# Patient Record
Sex: Female | Born: 1945 | Race: White | Hispanic: No | Marital: Married | Smoking: Never smoker
Health system: Southern US, Community
[De-identification: ages and names within clinical notes are randomized; demographics above are authoritative.]

## PROBLEM LIST (undated history)

## (undated) DIAGNOSIS — E039 Hypothyroidism, unspecified: Secondary | ICD-10-CM

## (undated) DIAGNOSIS — M810 Age-related osteoporosis without current pathological fracture: Secondary | ICD-10-CM

## (undated) DIAGNOSIS — I10 Essential (primary) hypertension: Secondary | ICD-10-CM

## (undated) HISTORY — DX: Essential (primary) hypertension: I10

## (undated) HISTORY — DX: Hypothyroidism, unspecified: E03.9

## (undated) HISTORY — DX: Age-related osteoporosis without current pathological fracture: M81.0

## (undated) HISTORY — PX: HAND SURGERY: SHX662

---

## 1998-02-14 ENCOUNTER — Emergency Department (HOSPITAL_COMMUNITY): Admission: EM | Admit: 1998-02-14 | Discharge: 1998-02-14 | Payer: Self-pay

## 1998-02-26 ENCOUNTER — Emergency Department (HOSPITAL_COMMUNITY): Admission: EM | Admit: 1998-02-26 | Discharge: 1998-02-26 | Payer: Self-pay | Admitting: Emergency Medicine

## 1999-06-24 ENCOUNTER — Emergency Department (HOSPITAL_COMMUNITY): Admission: EM | Admit: 1999-06-24 | Discharge: 1999-06-24 | Payer: Self-pay

## 1999-06-30 ENCOUNTER — Ambulatory Visit (HOSPITAL_BASED_OUTPATIENT_CLINIC_OR_DEPARTMENT_OTHER): Admission: RE | Admit: 1999-06-30 | Discharge: 1999-06-30 | Payer: Self-pay | Admitting: Orthopedic Surgery

## 2000-04-13 ENCOUNTER — Encounter: Admission: RE | Admit: 2000-04-13 | Discharge: 2000-04-13 | Payer: Self-pay | Admitting: Family Medicine

## 2000-04-13 ENCOUNTER — Encounter: Payer: Self-pay | Admitting: Family Medicine

## 2000-04-19 ENCOUNTER — Encounter: Payer: Self-pay | Admitting: Family Medicine

## 2000-04-19 ENCOUNTER — Encounter: Admission: RE | Admit: 2000-04-19 | Discharge: 2000-04-19 | Payer: Self-pay | Admitting: Family Medicine

## 2003-12-03 ENCOUNTER — Ambulatory Visit: Payer: Self-pay

## 2003-12-11 ENCOUNTER — Ambulatory Visit: Payer: Self-pay

## 2003-12-17 ENCOUNTER — Ambulatory Visit: Payer: Self-pay

## 2004-01-07 ENCOUNTER — Ambulatory Visit: Payer: Self-pay

## 2004-01-15 ENCOUNTER — Ambulatory Visit: Payer: Self-pay

## 2004-02-11 ENCOUNTER — Ambulatory Visit: Payer: Self-pay

## 2004-03-17 ENCOUNTER — Ambulatory Visit: Payer: Self-pay

## 2004-04-01 ENCOUNTER — Ambulatory Visit: Payer: Self-pay

## 2004-04-07 ENCOUNTER — Ambulatory Visit: Payer: Self-pay

## 2004-04-21 ENCOUNTER — Ambulatory Visit: Payer: Self-pay

## 2004-04-29 ENCOUNTER — Ambulatory Visit: Payer: Self-pay

## 2004-05-05 ENCOUNTER — Ambulatory Visit: Payer: Self-pay

## 2004-05-26 ENCOUNTER — Ambulatory Visit: Payer: Self-pay

## 2004-06-09 ENCOUNTER — Ambulatory Visit: Payer: Self-pay

## 2004-07-01 ENCOUNTER — Ambulatory Visit: Payer: Self-pay

## 2004-07-07 ENCOUNTER — Ambulatory Visit: Payer: Self-pay

## 2004-07-21 ENCOUNTER — Ambulatory Visit: Payer: Self-pay

## 2004-07-28 ENCOUNTER — Ambulatory Visit: Payer: Self-pay

## 2004-08-11 ENCOUNTER — Ambulatory Visit: Payer: Self-pay

## 2004-08-18 ENCOUNTER — Ambulatory Visit: Payer: Self-pay

## 2004-08-25 ENCOUNTER — Ambulatory Visit: Payer: Self-pay

## 2004-09-08 ENCOUNTER — Ambulatory Visit: Payer: Self-pay

## 2004-09-22 ENCOUNTER — Ambulatory Visit: Payer: Self-pay

## 2004-09-30 ENCOUNTER — Ambulatory Visit: Payer: Self-pay

## 2004-10-07 ENCOUNTER — Ambulatory Visit: Payer: Self-pay

## 2004-10-13 ENCOUNTER — Ambulatory Visit: Payer: Self-pay

## 2004-10-20 ENCOUNTER — Ambulatory Visit: Payer: Self-pay

## 2004-10-27 ENCOUNTER — Ambulatory Visit: Payer: Self-pay

## 2004-11-03 ENCOUNTER — Ambulatory Visit: Payer: Self-pay

## 2004-12-01 ENCOUNTER — Ambulatory Visit: Payer: Self-pay

## 2004-12-15 ENCOUNTER — Ambulatory Visit: Payer: Self-pay

## 2005-01-19 ENCOUNTER — Ambulatory Visit: Payer: Self-pay

## 2005-02-03 ENCOUNTER — Ambulatory Visit: Payer: Self-pay

## 2005-03-09 ENCOUNTER — Ambulatory Visit: Payer: Self-pay

## 2005-03-16 ENCOUNTER — Ambulatory Visit: Payer: Self-pay

## 2005-03-31 ENCOUNTER — Ambulatory Visit: Payer: Self-pay

## 2005-04-06 ENCOUNTER — Ambulatory Visit: Payer: Self-pay

## 2005-04-20 ENCOUNTER — Ambulatory Visit: Payer: Self-pay

## 2005-05-11 ENCOUNTER — Ambulatory Visit: Payer: Self-pay

## 2005-05-19 ENCOUNTER — Ambulatory Visit: Payer: Self-pay

## 2005-05-26 ENCOUNTER — Ambulatory Visit: Payer: Self-pay

## 2005-06-01 ENCOUNTER — Ambulatory Visit: Payer: Self-pay

## 2005-06-15 ENCOUNTER — Ambulatory Visit: Payer: Self-pay

## 2005-06-22 ENCOUNTER — Ambulatory Visit: Payer: Self-pay

## 2005-06-29 ENCOUNTER — Ambulatory Visit: Payer: Self-pay

## 2005-07-27 ENCOUNTER — Ambulatory Visit: Payer: Self-pay

## 2005-08-03 ENCOUNTER — Ambulatory Visit: Payer: Self-pay

## 2005-08-10 ENCOUNTER — Ambulatory Visit: Payer: Self-pay

## 2005-08-17 ENCOUNTER — Ambulatory Visit: Payer: Self-pay

## 2005-08-24 ENCOUNTER — Ambulatory Visit: Payer: Self-pay

## 2005-09-07 ENCOUNTER — Ambulatory Visit: Payer: Self-pay

## 2005-10-05 ENCOUNTER — Ambulatory Visit: Payer: Self-pay

## 2005-10-12 ENCOUNTER — Ambulatory Visit: Payer: Self-pay

## 2005-10-19 ENCOUNTER — Ambulatory Visit: Payer: Self-pay

## 2005-11-10 ENCOUNTER — Ambulatory Visit: Payer: Self-pay

## 2005-11-16 ENCOUNTER — Ambulatory Visit: Payer: Self-pay

## 2005-11-23 ENCOUNTER — Ambulatory Visit: Payer: Self-pay

## 2005-11-30 ENCOUNTER — Ambulatory Visit: Payer: Self-pay

## 2005-12-07 ENCOUNTER — Ambulatory Visit: Payer: Self-pay

## 2005-12-14 ENCOUNTER — Ambulatory Visit: Payer: Self-pay

## 2006-01-04 ENCOUNTER — Ambulatory Visit: Payer: Self-pay

## 2006-01-11 ENCOUNTER — Ambulatory Visit: Payer: Self-pay

## 2006-01-18 ENCOUNTER — Ambulatory Visit: Payer: Self-pay

## 2006-02-08 ENCOUNTER — Ambulatory Visit: Payer: Self-pay

## 2006-03-08 ENCOUNTER — Ambulatory Visit: Payer: Self-pay

## 2006-03-29 ENCOUNTER — Ambulatory Visit: Payer: Self-pay

## 2006-04-06 ENCOUNTER — Ambulatory Visit: Payer: Self-pay

## 2006-04-20 ENCOUNTER — Ambulatory Visit: Payer: Self-pay

## 2006-05-03 ENCOUNTER — Ambulatory Visit: Payer: Self-pay

## 2006-05-11 ENCOUNTER — Ambulatory Visit: Payer: Self-pay

## 2006-05-25 ENCOUNTER — Ambulatory Visit: Payer: Self-pay

## 2006-05-31 ENCOUNTER — Ambulatory Visit: Payer: Self-pay

## 2006-06-21 ENCOUNTER — Ambulatory Visit: Payer: Self-pay

## 2006-06-29 ENCOUNTER — Ambulatory Visit: Payer: Self-pay

## 2006-09-21 ENCOUNTER — Ambulatory Visit: Payer: Self-pay

## 2006-09-27 ENCOUNTER — Ambulatory Visit: Payer: Self-pay

## 2006-10-04 ENCOUNTER — Ambulatory Visit: Payer: Self-pay

## 2006-10-12 ENCOUNTER — Ambulatory Visit: Payer: Self-pay

## 2006-11-15 ENCOUNTER — Ambulatory Visit: Payer: Self-pay

## 2006-12-14 ENCOUNTER — Ambulatory Visit: Payer: Self-pay

## 2006-12-16 ENCOUNTER — Encounter: Admission: RE | Admit: 2006-12-16 | Discharge: 2006-12-16 | Payer: Self-pay | Admitting: Emergency Medicine

## 2007-03-21 ENCOUNTER — Ambulatory Visit: Payer: Self-pay

## 2007-04-04 ENCOUNTER — Ambulatory Visit: Payer: Self-pay

## 2007-08-16 ENCOUNTER — Ambulatory Visit: Payer: Self-pay

## 2008-07-17 ENCOUNTER — Ambulatory Visit: Payer: Self-pay

## 2008-07-24 ENCOUNTER — Ambulatory Visit: Payer: Self-pay

## 2008-07-31 ENCOUNTER — Ambulatory Visit: Payer: Self-pay

## 2008-08-07 ENCOUNTER — Ambulatory Visit: Payer: Self-pay

## 2008-08-14 ENCOUNTER — Ambulatory Visit: Payer: Self-pay

## 2008-10-08 ENCOUNTER — Ambulatory Visit: Payer: Self-pay

## 2008-10-22 ENCOUNTER — Ambulatory Visit: Payer: Self-pay

## 2008-10-29 ENCOUNTER — Ambulatory Visit: Payer: Self-pay

## 2008-11-06 ENCOUNTER — Ambulatory Visit: Payer: Self-pay

## 2008-11-26 ENCOUNTER — Ambulatory Visit: Payer: Self-pay

## 2008-12-03 ENCOUNTER — Ambulatory Visit: Payer: Self-pay

## 2008-12-10 ENCOUNTER — Ambulatory Visit: Payer: Self-pay

## 2008-12-18 ENCOUNTER — Ambulatory Visit: Payer: Self-pay

## 2009-01-07 ENCOUNTER — Ambulatory Visit: Payer: Self-pay

## 2009-01-14 ENCOUNTER — Ambulatory Visit: Payer: Self-pay

## 2009-03-18 ENCOUNTER — Ambulatory Visit: Payer: Self-pay

## 2009-03-25 ENCOUNTER — Ambulatory Visit: Payer: Self-pay

## 2009-04-08 ENCOUNTER — Ambulatory Visit: Payer: Self-pay

## 2009-04-23 ENCOUNTER — Ambulatory Visit: Payer: Self-pay

## 2009-04-29 ENCOUNTER — Ambulatory Visit: Payer: Self-pay

## 2009-05-06 ENCOUNTER — Ambulatory Visit: Payer: Self-pay

## 2009-05-13 ENCOUNTER — Ambulatory Visit: Payer: Self-pay

## 2009-05-21 ENCOUNTER — Ambulatory Visit: Payer: Self-pay

## 2009-06-04 ENCOUNTER — Ambulatory Visit: Payer: Self-pay

## 2009-07-08 ENCOUNTER — Ambulatory Visit: Payer: Self-pay

## 2009-07-29 ENCOUNTER — Ambulatory Visit: Payer: Self-pay

## 2009-08-05 ENCOUNTER — Ambulatory Visit: Payer: Self-pay

## 2009-10-07 ENCOUNTER — Ambulatory Visit: Payer: Self-pay

## 2009-10-14 ENCOUNTER — Ambulatory Visit: Payer: Self-pay

## 2009-10-28 ENCOUNTER — Ambulatory Visit: Payer: Self-pay

## 2009-11-26 ENCOUNTER — Ambulatory Visit: Payer: Self-pay

## 2010-01-13 ENCOUNTER — Ambulatory Visit: Payer: Self-pay

## 2010-02-05 ENCOUNTER — Ambulatory Visit: Admission: RE | Admit: 2010-02-05 | Discharge: 2010-02-05 | Payer: Self-pay | Source: Home / Self Care

## 2010-03-24 ENCOUNTER — Ambulatory Visit (INDEPENDENT_AMBULATORY_CARE_PROVIDER_SITE_OTHER): Payer: Self-pay

## 2010-03-24 DIAGNOSIS — F4320 Adjustment disorder, unspecified: Secondary | ICD-10-CM

## 2010-04-07 ENCOUNTER — Ambulatory Visit (INDEPENDENT_AMBULATORY_CARE_PROVIDER_SITE_OTHER): Payer: Self-pay

## 2010-04-07 DIAGNOSIS — F4320 Adjustment disorder, unspecified: Secondary | ICD-10-CM

## 2010-06-09 ENCOUNTER — Ambulatory Visit (INDEPENDENT_AMBULATORY_CARE_PROVIDER_SITE_OTHER): Payer: Self-pay

## 2010-06-09 DIAGNOSIS — F4320 Adjustment disorder, unspecified: Secondary | ICD-10-CM

## 2010-06-17 ENCOUNTER — Ambulatory Visit (INDEPENDENT_AMBULATORY_CARE_PROVIDER_SITE_OTHER): Payer: Self-pay

## 2010-06-17 DIAGNOSIS — F4320 Adjustment disorder, unspecified: Secondary | ICD-10-CM
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
## 2010-06-30 ENCOUNTER — Ambulatory Visit: Payer: Self-pay

## 2010-08-11 ENCOUNTER — Ambulatory Visit (INDEPENDENT_AMBULATORY_CARE_PROVIDER_SITE_OTHER): Payer: Self-pay

## 2010-08-11 DIAGNOSIS — F4320 Adjustment disorder, unspecified: Secondary | ICD-10-CM

## 2010-08-18 ENCOUNTER — Ambulatory Visit (INDEPENDENT_AMBULATORY_CARE_PROVIDER_SITE_OTHER): Payer: Self-pay

## 2010-08-18 DIAGNOSIS — F4320 Adjustment disorder, unspecified: Secondary | ICD-10-CM

## 2010-09-08 ENCOUNTER — Ambulatory Visit (INDEPENDENT_AMBULATORY_CARE_PROVIDER_SITE_OTHER): Payer: Self-pay

## 2010-09-08 DIAGNOSIS — F4320 Adjustment disorder, unspecified: Secondary | ICD-10-CM

## 2011-01-20 ENCOUNTER — Ambulatory Visit (INDEPENDENT_AMBULATORY_CARE_PROVIDER_SITE_OTHER): Payer: Self-pay

## 2011-01-20 DIAGNOSIS — F4320 Adjustment disorder, unspecified: Secondary | ICD-10-CM

## 2011-02-03 ENCOUNTER — Ambulatory Visit (INDEPENDENT_AMBULATORY_CARE_PROVIDER_SITE_OTHER): Payer: Self-pay

## 2011-02-03 DIAGNOSIS — F4320 Adjustment disorder, unspecified: Secondary | ICD-10-CM

## 2011-07-28 ENCOUNTER — Ambulatory Visit (INDEPENDENT_AMBULATORY_CARE_PROVIDER_SITE_OTHER): Payer: Self-pay

## 2011-07-28 DIAGNOSIS — F4320 Adjustment disorder, unspecified: Secondary | ICD-10-CM

## 2011-10-20 ENCOUNTER — Ambulatory Visit (INDEPENDENT_AMBULATORY_CARE_PROVIDER_SITE_OTHER): Payer: PRIVATE HEALTH INSURANCE | Admitting: Family Medicine

## 2011-10-20 ENCOUNTER — Ambulatory Visit: Payer: PRIVATE HEALTH INSURANCE

## 2011-10-20 VITALS — BP 170/94 | HR 86 | Temp 98.9°F | Resp 16 | Ht 64.0 in | Wt 149.2 lb

## 2011-10-20 DIAGNOSIS — S92309A Fracture of unspecified metatarsal bone(s), unspecified foot, initial encounter for closed fracture: Secondary | ICD-10-CM

## 2011-10-20 DIAGNOSIS — M79673 Pain in unspecified foot: Secondary | ICD-10-CM

## 2011-10-20 DIAGNOSIS — S92353A Displaced fracture of fifth metatarsal bone, unspecified foot, initial encounter for closed fracture: Secondary | ICD-10-CM | POA: Insufficient documentation

## 2011-10-20 DIAGNOSIS — M79609 Pain in unspecified limb: Secondary | ICD-10-CM

## 2011-10-20 MED ORDER — TRAMADOL HCL 50 MG PO TABS: 50.0000 mg | ORAL_TABLET | Freq: Three times a day (TID) | ORAL | Status: DC | PRN

## 2011-10-20 NOTE — Patient Instructions (Signed)
Given verbal instructions

## 2011-10-20 NOTE — Assessment & Plan Note (Signed)
Patient does have a nondisplaced nonangulated fracture of the fifth metatarsal of the shaft.  Postop boot for the next 3 weeks. Patient is able to weight-bear as tolerated. Patient is going to be sent and tramadol for severe pain. Patient has an allergy to aspirin so we'll avoid anti-inflammatories such as Motrin. Patient will followup in 3 weeks' time for reevaluation and at that time we'll get further imaging to make sure the patient's is healing appropriately.

## 2011-10-20 NOTE — Progress Notes (Signed)
  Subjective:    Patient ID: Pamela Kim, female    DOB: Jul 27, 1945, 66 y.o.   MRN: [id number]  HPI  66 year old female coming in with right ankle pain. Patient states that she woke up from a nap today and had significant pain when she took her first. Patient heard a loud pop and then had significant swelling of her foot and lateral aspect of her ankle. Patient was able to bear weight but had significant pain on palpation over the swelling area. Patient came in to be seen in. Patient denies any numbness or any discoloration in the area but still has significant swelling she states. Patient has had multiple ankle sprains on the side. Patient is able to bear weight at this time but does have pain.  Past medical surgical and family history unremarkable as related to the chief complaint. Review of systems as related to the chief complaint is in history of present illness.   Review of Systems     Objective:   Physical Exam General: No apparent distress alert and oriented female patient does have some pressured speech at baseline. Respiratory: Patient's recent full sentences does not appear short of breath Right ankle exam: Patient is able to move of the entire right ankle and has no pain on the lateral or medial malleolus. Patient also has no pain over the mortise. Patient has no swelling of the ankle. Distal to the ankle at the area of the ATF L. patient does have a 3 cm area of swelling about the size of a golf ball and is minorly tender to palpation. Patient does have significant tenderness over the fifth metatarsal but at the shaft not at the head were distally. Patient is able to bear weight on foot. Patient is neurovascularly intact distally and 2+ DTR of the Achilles tendon.       Assessment & Plan:  X-rays were ordered and reviewed by me today. Patient does have a fifth metatarsal shaft fracture that is oblique but with nondisplaced non-angulation. Patient's ankle mortise is intact  patient does have a significant effusion on the lateral aspect of the foot.

## 2011-10-21 ENCOUNTER — Telehealth: Payer: Self-pay

## 2011-10-21 NOTE — Telephone Encounter (Signed)
Pt is wanting to talk with dr Ayesha Mohair about referring her to an orthopedic office   Best number [phone]

## 2011-10-21 NOTE — Progress Notes (Signed)
Xray read and patient discussed with Dr.Smith. Xray report noted:IMPRESSION: Nondisplaced fracture mid diaphysis fifth metatarsal. Agree with assessment and plan of care per Dr Michaelle Copas  note.

## 2011-10-21 NOTE — Telephone Encounter (Signed)
I will be happy to go ahead and set up a referral for her. Does she already have an orthopedist, or would she like for me to pick one?

## 2011-10-22 NOTE — Telephone Encounter (Signed)
Spoke w/pt who reported that she was actually able to get in to see a podiatrist yesterday who has changed her tx to stay off of foot for a week, and she will wait to see what happens w/this. Pt agrees to RTC or CB for ortho referral if needed and she thanked [place] and stated that Dr Ayesha Mohair was very nice and did a great job.

## 2011-11-16 ENCOUNTER — Ambulatory Visit (INDEPENDENT_AMBULATORY_CARE_PROVIDER_SITE_OTHER): Payer: PRIVATE HEALTH INSURANCE | Admitting: Emergency Medicine

## 2011-11-16 ENCOUNTER — Telehealth: Payer: Self-pay

## 2011-11-16 VITALS — BP 159/79 | HR 90 | Temp 98.4°F | Resp 18

## 2011-11-16 DIAGNOSIS — R110 Nausea: Secondary | ICD-10-CM

## 2011-11-16 DIAGNOSIS — E860 Dehydration: Secondary | ICD-10-CM

## 2011-11-16 DIAGNOSIS — K5289 Other specified noninfective gastroenteritis and colitis: Secondary | ICD-10-CM

## 2011-11-16 DIAGNOSIS — K529 Noninfective gastroenteritis and colitis, unspecified: Secondary | ICD-10-CM

## 2011-11-16 MED ORDER — LOPERAMIDE HCL 2 MG PO TABS: ORAL_TABLET | ORAL | Status: DC

## 2011-11-16 MED ORDER — ONDANSETRON 4 MG PO TBDP
4.0000 mg | ORAL_TABLET | Freq: Once | ORAL | Status: AC
  Administered 2011-11-16: 4 mg via ORAL

## 2011-11-16 MED ORDER — ONDANSETRON 4 MG PO TBDP: 4.0000 mg | ORAL_TABLET | Freq: Three times a day (TID) | ORAL | Status: DC | PRN

## 2011-11-16 NOTE — Progress Notes (Signed)
[facility] [address] [place] [address] [phone]  Date:  11/16/2011   Name:  Pamela Kim   DOB:  Sep 24, 1945   MRN:  [id number]  PCP:  Roque Lias, MD    Chief Complaint: Emesis   History of Present Illness:  Pamela Kim is a 66 y.o. very pleasant female patient who presents with the following:  Ill since 0200 this morning with nausea and vomiting and diarrhea.  Complicated by her inability to find a comfortable position for her broken foot.  Feels hot but no fever and does have chills.  Stools are watery and frequent with cramping.  Not able tolerate liquids.  Child was visiting over the weekend and had been ill last week with GI virus.  No OTC meds for treatment.  No blood mucous or pus in stools.  Patient Active Problem List  Diagnosis  . Fracture of 5th metatarsal    No past medical history on file.  No past surgical history on file.  History  Substance Use Topics  . Smoking status: Never Smoker   . Smokeless tobacco: Not on file  . Alcohol Use: Not on file    No family history on file.  Allergies  Allergen Reactions  . Aspirin Hives  . Keflex (Cephalexin) Hives  . Penicillins Hives    Medication list has been reviewed and updated.  Current Outpatient Prescriptions on File Prior to Visit  Medication Sig Dispense Refill  . thyroid (ARMOUR) 30 MG tablet Take 30 mg by mouth 2 (two) times daily.      . traMADol (ULTRAM) 50 MG tablet Take 1 tablet (50 mg total) by mouth every 8 (eight) hours as needed for pain.  30 tablet  0    Review of Systems:  As per HPI, otherwise negative.    Physical Examination: Filed Vitals:   11/16/11 1054  BP: 159/79  Pulse: 90  Temp: 98.4 F (36.9 C)  Resp: 18   There were no vitals filed for this visit. There is no height or weight on file to calculate BMI. Ideal Body Weight:     GEN: WDWN, NAD, Non-toxic, A & O x 3.  dehydrated HEENT: Atraumatic, Normocephalic.  Neck supple. No masses, No LAD. Ears and Nose: No external deformity. CV: RRR, No M/G/R. No JVD. No thrill. No extra heart sounds. PULM: CTA B, no wheezes, crackles, rhonchi. No retractions. No resp. distress. No accessory muscle use. ABD: S, NT, ND, +BS. No rebound. No HSM. EXTR: No c/c/e NEURO Normal gait.  PSYCH: Normally interactive. Conversant. Not depressed or anxious appearing.  Calm demeanor.      Assessment and Plan: Dehydration Gastrointestinal virus History of metatarsal fracture. Zofran, imodium Liquids 2 liters NSS IV  Carmelina Dane, MD  I have reviewed and agree with documentation. Robert P. Merla Riches, M.D.

## 2011-11-16 NOTE — Telephone Encounter (Signed)
Pt is throwing up - wants phrenilin for vomiting.      [facility] [address]   [phone]

## 2011-11-17 ENCOUNTER — Other Ambulatory Visit: Payer: Self-pay | Admitting: Emergency Medicine

## 2011-11-17 MED ORDER — PROMETHAZINE HCL 25 MG RE SUPP: 25.0000 mg | Freq: Four times a day (QID) | RECTAL | Status: DC | PRN

## 2011-11-17 NOTE — Telephone Encounter (Signed)
Patient called back asking for phenergan, you saw her earlier, she states Zofran not helping

## 2012-07-07 DIAGNOSIS — S92919A Unspecified fracture of unspecified toe(s), initial encounter for closed fracture: Secondary | ICD-10-CM | POA: Diagnosis not present

## 2012-08-24 DIAGNOSIS — E039 Hypothyroidism, unspecified: Secondary | ICD-10-CM | POA: Diagnosis not present

## 2012-08-24 DIAGNOSIS — R030 Elevated blood-pressure reading, without diagnosis of hypertension: Secondary | ICD-10-CM | POA: Diagnosis not present

## 2012-09-28 DIAGNOSIS — H53009 Unspecified amblyopia, unspecified eye: Secondary | ICD-10-CM | POA: Diagnosis not present

## 2012-09-28 DIAGNOSIS — H35419 Lattice degeneration of retina, unspecified eye: Secondary | ICD-10-CM | POA: Diagnosis not present

## 2012-10-13 DIAGNOSIS — T6391XA Toxic effect of contact with unspecified venomous animal, accidental (unintentional), initial encounter: Secondary | ICD-10-CM | POA: Diagnosis not present

## 2012-11-28 ENCOUNTER — Ambulatory Visit (INDEPENDENT_AMBULATORY_CARE_PROVIDER_SITE_OTHER): Payer: Self-pay

## 2012-11-28 DIAGNOSIS — F4320 Adjustment disorder, unspecified: Secondary | ICD-10-CM

## 2012-12-18 DIAGNOSIS — E039 Hypothyroidism, unspecified: Secondary | ICD-10-CM | POA: Diagnosis not present

## 2012-12-18 DIAGNOSIS — E785 Hyperlipidemia, unspecified: Secondary | ICD-10-CM | POA: Diagnosis not present

## 2012-12-18 DIAGNOSIS — I10 Essential (primary) hypertension: Secondary | ICD-10-CM | POA: Diagnosis not present

## 2012-12-25 DIAGNOSIS — E039 Hypothyroidism, unspecified: Secondary | ICD-10-CM | POA: Diagnosis not present

## 2012-12-25 DIAGNOSIS — R030 Elevated blood-pressure reading, without diagnosis of hypertension: Secondary | ICD-10-CM | POA: Diagnosis not present

## 2012-12-25 DIAGNOSIS — Z23 Encounter for immunization: Secondary | ICD-10-CM | POA: Diagnosis not present

## 2012-12-25 DIAGNOSIS — E785 Hyperlipidemia, unspecified: Secondary | ICD-10-CM | POA: Diagnosis not present

## 2013-01-03 DIAGNOSIS — H25049 Posterior subcapsular polar age-related cataract, unspecified eye: Secondary | ICD-10-CM | POA: Diagnosis not present

## 2013-01-03 DIAGNOSIS — H25019 Cortical age-related cataract, unspecified eye: Secondary | ICD-10-CM | POA: Diagnosis not present

## 2013-01-03 DIAGNOSIS — H2510 Age-related nuclear cataract, unspecified eye: Secondary | ICD-10-CM | POA: Diagnosis not present

## 2013-02-06 DIAGNOSIS — B029 Zoster without complications: Secondary | ICD-10-CM | POA: Diagnosis not present

## 2013-06-08 DIAGNOSIS — E785 Hyperlipidemia, unspecified: Secondary | ICD-10-CM | POA: Diagnosis not present

## 2013-06-08 DIAGNOSIS — E039 Hypothyroidism, unspecified: Secondary | ICD-10-CM | POA: Diagnosis not present

## 2013-06-14 DIAGNOSIS — E039 Hypothyroidism, unspecified: Secondary | ICD-10-CM | POA: Diagnosis not present

## 2013-06-27 DIAGNOSIS — E785 Hyperlipidemia, unspecified: Secondary | ICD-10-CM | POA: Diagnosis not present

## 2013-06-27 DIAGNOSIS — R030 Elevated blood-pressure reading, without diagnosis of hypertension: Secondary | ICD-10-CM | POA: Diagnosis not present

## 2013-06-27 DIAGNOSIS — E039 Hypothyroidism, unspecified: Secondary | ICD-10-CM | POA: Diagnosis not present

## 2013-12-31 DIAGNOSIS — E039 Hypothyroidism, unspecified: Secondary | ICD-10-CM | POA: Diagnosis not present

## 2013-12-31 DIAGNOSIS — E785 Hyperlipidemia, unspecified: Secondary | ICD-10-CM | POA: Diagnosis not present

## 2013-12-31 DIAGNOSIS — M859 Disorder of bone density and structure, unspecified: Secondary | ICD-10-CM | POA: Diagnosis not present

## 2013-12-31 DIAGNOSIS — Z0000 Encounter for general adult medical examination without abnormal findings: Secondary | ICD-10-CM | POA: Diagnosis not present

## 2014-01-07 DIAGNOSIS — E039 Hypothyroidism, unspecified: Secondary | ICD-10-CM | POA: Diagnosis not present

## 2014-01-07 DIAGNOSIS — E785 Hyperlipidemia, unspecified: Secondary | ICD-10-CM | POA: Diagnosis not present

## 2014-01-07 DIAGNOSIS — M810 Age-related osteoporosis without current pathological fracture: Secondary | ICD-10-CM | POA: Diagnosis not present

## 2014-01-10 DIAGNOSIS — H2513 Age-related nuclear cataract, bilateral: Secondary | ICD-10-CM | POA: Diagnosis not present

## 2014-01-10 DIAGNOSIS — H25013 Cortical age-related cataract, bilateral: Secondary | ICD-10-CM | POA: Diagnosis not present

## 2014-01-10 DIAGNOSIS — H35413 Lattice degeneration of retina, bilateral: Secondary | ICD-10-CM | POA: Diagnosis not present

## 2014-01-10 DIAGNOSIS — H53001 Unspecified amblyopia, right eye: Secondary | ICD-10-CM | POA: Diagnosis not present

## 2014-01-12 DIAGNOSIS — Z23 Encounter for immunization: Secondary | ICD-10-CM | POA: Diagnosis not present

## 2014-02-01 HISTORY — PX: CATARACT EXTRACTION, BILATERAL: SHX1313

## 2014-02-07 DIAGNOSIS — H35413 Lattice degeneration of retina, bilateral: Secondary | ICD-10-CM | POA: Diagnosis not present

## 2014-02-07 DIAGNOSIS — H33323 Round hole, bilateral: Secondary | ICD-10-CM | POA: Diagnosis not present

## 2014-02-18 DIAGNOSIS — H25013 Cortical age-related cataract, bilateral: Secondary | ICD-10-CM | POA: Diagnosis not present

## 2014-02-18 DIAGNOSIS — H53001 Unspecified amblyopia, right eye: Secondary | ICD-10-CM | POA: Diagnosis not present

## 2014-02-18 DIAGNOSIS — H2513 Age-related nuclear cataract, bilateral: Secondary | ICD-10-CM | POA: Diagnosis not present

## 2014-03-13 DIAGNOSIS — H25811 Combined forms of age-related cataract, right eye: Secondary | ICD-10-CM | POA: Diagnosis not present

## 2014-03-13 DIAGNOSIS — H53009 Unspecified amblyopia, unspecified eye: Secondary | ICD-10-CM | POA: Diagnosis not present

## 2014-03-13 DIAGNOSIS — H25011 Cortical age-related cataract, right eye: Secondary | ICD-10-CM | POA: Diagnosis not present

## 2014-03-13 DIAGNOSIS — H2511 Age-related nuclear cataract, right eye: Secondary | ICD-10-CM | POA: Diagnosis not present

## 2014-03-27 DIAGNOSIS — H25012 Cortical age-related cataract, left eye: Secondary | ICD-10-CM | POA: Diagnosis not present

## 2014-03-27 DIAGNOSIS — H2512 Age-related nuclear cataract, left eye: Secondary | ICD-10-CM | POA: Diagnosis not present

## 2014-03-27 DIAGNOSIS — H25812 Combined forms of age-related cataract, left eye: Secondary | ICD-10-CM | POA: Diagnosis not present

## 2014-04-02 DIAGNOSIS — E039 Hypothyroidism, unspecified: Secondary | ICD-10-CM | POA: Diagnosis not present

## 2014-04-09 DIAGNOSIS — E039 Hypothyroidism, unspecified: Secondary | ICD-10-CM | POA: Diagnosis not present

## 2014-05-20 DIAGNOSIS — E039 Hypothyroidism, unspecified: Secondary | ICD-10-CM | POA: Diagnosis not present

## 2014-07-12 DIAGNOSIS — E785 Hyperlipidemia, unspecified: Secondary | ICD-10-CM | POA: Diagnosis not present

## 2014-07-12 DIAGNOSIS — M859 Disorder of bone density and structure, unspecified: Secondary | ICD-10-CM | POA: Diagnosis not present

## 2014-07-16 DIAGNOSIS — E785 Hyperlipidemia, unspecified: Secondary | ICD-10-CM | POA: Diagnosis not present

## 2014-07-16 DIAGNOSIS — M810 Age-related osteoporosis without current pathological fracture: Secondary | ICD-10-CM | POA: Diagnosis not present

## 2014-07-16 DIAGNOSIS — E039 Hypothyroidism, unspecified: Secondary | ICD-10-CM | POA: Diagnosis not present

## 2014-10-10 DIAGNOSIS — H35413 Lattice degeneration of retina, bilateral: Secondary | ICD-10-CM | POA: Diagnosis not present

## 2014-10-10 DIAGNOSIS — H43811 Vitreous degeneration, right eye: Secondary | ICD-10-CM | POA: Diagnosis not present

## 2014-10-14 DIAGNOSIS — H52203 Unspecified astigmatism, bilateral: Secondary | ICD-10-CM | POA: Diagnosis not present

## 2014-10-14 DIAGNOSIS — Z961 Presence of intraocular lens: Secondary | ICD-10-CM | POA: Diagnosis not present

## 2014-10-14 DIAGNOSIS — H53001 Unspecified amblyopia, right eye: Secondary | ICD-10-CM | POA: Diagnosis not present

## 2014-10-14 DIAGNOSIS — H26491 Other secondary cataract, right eye: Secondary | ICD-10-CM | POA: Diagnosis not present

## 2014-10-17 DIAGNOSIS — E039 Hypothyroidism, unspecified: Secondary | ICD-10-CM | POA: Diagnosis not present

## 2014-10-21 DIAGNOSIS — E039 Hypothyroidism, unspecified: Secondary | ICD-10-CM | POA: Diagnosis not present

## 2015-01-06 DIAGNOSIS — M810 Age-related osteoporosis without current pathological fracture: Secondary | ICD-10-CM | POA: Diagnosis not present

## 2015-01-06 DIAGNOSIS — E785 Hyperlipidemia, unspecified: Secondary | ICD-10-CM | POA: Diagnosis not present

## 2015-01-16 DIAGNOSIS — E785 Hyperlipidemia, unspecified: Secondary | ICD-10-CM | POA: Diagnosis not present

## 2015-01-16 DIAGNOSIS — N182 Chronic kidney disease, stage 2 (mild): Secondary | ICD-10-CM | POA: Diagnosis not present

## 2015-01-16 DIAGNOSIS — M810 Age-related osteoporosis without current pathological fracture: Secondary | ICD-10-CM | POA: Diagnosis not present

## 2015-01-16 DIAGNOSIS — E039 Hypothyroidism, unspecified: Secondary | ICD-10-CM | POA: Diagnosis not present

## 2015-05-05 DIAGNOSIS — E039 Hypothyroidism, unspecified: Secondary | ICD-10-CM | POA: Diagnosis not present

## 2015-07-14 DIAGNOSIS — E039 Hypothyroidism, unspecified: Secondary | ICD-10-CM | POA: Diagnosis not present

## 2015-07-14 DIAGNOSIS — E559 Vitamin D deficiency, unspecified: Secondary | ICD-10-CM | POA: Diagnosis not present

## 2015-07-14 DIAGNOSIS — M810 Age-related osteoporosis without current pathological fracture: Secondary | ICD-10-CM | POA: Diagnosis not present

## 2015-07-14 DIAGNOSIS — E785 Hyperlipidemia, unspecified: Secondary | ICD-10-CM | POA: Diagnosis not present

## 2015-07-24 DIAGNOSIS — E785 Hyperlipidemia, unspecified: Secondary | ICD-10-CM | POA: Diagnosis not present

## 2015-07-24 DIAGNOSIS — M810 Age-related osteoporosis without current pathological fracture: Secondary | ICD-10-CM | POA: Diagnosis not present

## 2015-07-24 DIAGNOSIS — E039 Hypothyroidism, unspecified: Secondary | ICD-10-CM | POA: Diagnosis not present

## 2015-07-24 DIAGNOSIS — N182 Chronic kidney disease, stage 2 (mild): Secondary | ICD-10-CM | POA: Diagnosis not present

## 2015-07-24 DIAGNOSIS — Z23 Encounter for immunization: Secondary | ICD-10-CM | POA: Diagnosis not present

## 2015-09-01 DIAGNOSIS — H35371 Puckering of macula, right eye: Secondary | ICD-10-CM | POA: Diagnosis not present

## 2015-09-01 DIAGNOSIS — Z961 Presence of intraocular lens: Secondary | ICD-10-CM | POA: Diagnosis not present

## 2015-09-01 DIAGNOSIS — H43813 Vitreous degeneration, bilateral: Secondary | ICD-10-CM | POA: Diagnosis not present

## 2015-09-01 DIAGNOSIS — H52203 Unspecified astigmatism, bilateral: Secondary | ICD-10-CM | POA: Diagnosis not present

## 2015-09-30 DIAGNOSIS — H35413 Lattice degeneration of retina, bilateral: Secondary | ICD-10-CM | POA: Diagnosis not present

## 2015-10-14 DIAGNOSIS — E785 Hyperlipidemia, unspecified: Secondary | ICD-10-CM | POA: Diagnosis not present

## 2015-10-14 DIAGNOSIS — M810 Age-related osteoporosis without current pathological fracture: Secondary | ICD-10-CM | POA: Diagnosis not present

## 2015-10-14 DIAGNOSIS — E039 Hypothyroidism, unspecified: Secondary | ICD-10-CM | POA: Diagnosis not present

## 2015-10-21 DIAGNOSIS — E039 Hypothyroidism, unspecified: Secondary | ICD-10-CM | POA: Diagnosis not present

## 2015-12-02 DIAGNOSIS — Z1211 Encounter for screening for malignant neoplasm of colon: Secondary | ICD-10-CM | POA: Diagnosis not present

## 2015-12-02 DIAGNOSIS — E039 Hypothyroidism, unspecified: Secondary | ICD-10-CM | POA: Diagnosis not present

## 2016-02-27 DIAGNOSIS — E039 Hypothyroidism, unspecified: Secondary | ICD-10-CM | POA: Diagnosis not present

## 2016-03-30 DIAGNOSIS — H33323 Round hole, bilateral: Secondary | ICD-10-CM | POA: Diagnosis not present

## 2016-03-30 DIAGNOSIS — H43813 Vitreous degeneration, bilateral: Secondary | ICD-10-CM | POA: Diagnosis not present

## 2016-05-04 DIAGNOSIS — E039 Hypothyroidism, unspecified: Secondary | ICD-10-CM | POA: Diagnosis not present

## 2016-07-02 DIAGNOSIS — E039 Hypothyroidism, unspecified: Secondary | ICD-10-CM | POA: Diagnosis not present

## 2016-07-05 DIAGNOSIS — E039 Hypothyroidism, unspecified: Secondary | ICD-10-CM | POA: Diagnosis not present

## 2016-09-03 DIAGNOSIS — E039 Hypothyroidism, unspecified: Secondary | ICD-10-CM | POA: Diagnosis not present

## 2016-09-07 DIAGNOSIS — H53001 Unspecified amblyopia, right eye: Secondary | ICD-10-CM | POA: Diagnosis not present

## 2016-09-07 DIAGNOSIS — H1859 Other hereditary corneal dystrophies: Secondary | ICD-10-CM | POA: Diagnosis not present

## 2016-09-07 DIAGNOSIS — H26491 Other secondary cataract, right eye: Secondary | ICD-10-CM | POA: Diagnosis not present

## 2016-09-07 DIAGNOSIS — H52203 Unspecified astigmatism, bilateral: Secondary | ICD-10-CM | POA: Diagnosis not present

## 2016-09-28 DIAGNOSIS — H35413 Lattice degeneration of retina, bilateral: Secondary | ICD-10-CM | POA: Diagnosis not present

## 2016-09-28 DIAGNOSIS — H43813 Vitreous degeneration, bilateral: Secondary | ICD-10-CM | POA: Diagnosis not present

## 2016-11-09 DIAGNOSIS — E039 Hypothyroidism, unspecified: Secondary | ICD-10-CM | POA: Diagnosis not present

## 2017-01-04 DIAGNOSIS — E039 Hypothyroidism, unspecified: Secondary | ICD-10-CM | POA: Diagnosis not present

## 2017-03-29 DIAGNOSIS — H35413 Lattice degeneration of retina, bilateral: Secondary | ICD-10-CM | POA: Diagnosis not present

## 2017-03-29 DIAGNOSIS — H43813 Vitreous degeneration, bilateral: Secondary | ICD-10-CM | POA: Diagnosis not present

## 2017-06-28 DIAGNOSIS — E039 Hypothyroidism, unspecified: Secondary | ICD-10-CM | POA: Diagnosis not present

## 2017-07-05 DIAGNOSIS — E039 Hypothyroidism, unspecified: Secondary | ICD-10-CM | POA: Diagnosis not present

## 2017-09-07 DIAGNOSIS — E039 Hypothyroidism, unspecified: Secondary | ICD-10-CM | POA: Diagnosis not present

## 2017-09-28 DIAGNOSIS — H43813 Vitreous degeneration, bilateral: Secondary | ICD-10-CM | POA: Diagnosis not present

## 2017-09-28 DIAGNOSIS — H35413 Lattice degeneration of retina, bilateral: Secondary | ICD-10-CM | POA: Diagnosis not present

## 2017-11-08 DIAGNOSIS — E039 Hypothyroidism, unspecified: Secondary | ICD-10-CM | POA: Diagnosis not present

## 2017-12-22 ENCOUNTER — Other Ambulatory Visit: Payer: Self-pay | Admitting: Family Medicine

## 2017-12-22 DIAGNOSIS — Z0000 Encounter for general adult medical examination without abnormal findings: Secondary | ICD-10-CM | POA: Diagnosis not present

## 2017-12-22 DIAGNOSIS — R030 Elevated blood-pressure reading, without diagnosis of hypertension: Secondary | ICD-10-CM | POA: Diagnosis not present

## 2017-12-22 DIAGNOSIS — E7849 Other hyperlipidemia: Secondary | ICD-10-CM | POA: Diagnosis not present

## 2017-12-22 DIAGNOSIS — E039 Hypothyroidism, unspecified: Secondary | ICD-10-CM | POA: Diagnosis not present

## 2017-12-22 DIAGNOSIS — I10 Essential (primary) hypertension: Secondary | ICD-10-CM | POA: Diagnosis not present

## 2017-12-22 DIAGNOSIS — Z1231 Encounter for screening mammogram for malignant neoplasm of breast: Secondary | ICD-10-CM

## 2017-12-22 DIAGNOSIS — E559 Vitamin D deficiency, unspecified: Secondary | ICD-10-CM | POA: Diagnosis not present

## 2017-12-22 DIAGNOSIS — Z23 Encounter for immunization: Secondary | ICD-10-CM | POA: Diagnosis not present

## 2017-12-22 DIAGNOSIS — G4700 Insomnia, unspecified: Secondary | ICD-10-CM | POA: Diagnosis not present

## 2017-12-22 DIAGNOSIS — R7301 Impaired fasting glucose: Secondary | ICD-10-CM | POA: Diagnosis not present

## 2018-02-08 DIAGNOSIS — Z1211 Encounter for screening for malignant neoplasm of colon: Secondary | ICD-10-CM | POA: Diagnosis not present

## 2018-02-08 DIAGNOSIS — E039 Hypothyroidism, unspecified: Secondary | ICD-10-CM | POA: Diagnosis not present

## 2018-02-08 DIAGNOSIS — K625 Hemorrhage of anus and rectum: Secondary | ICD-10-CM | POA: Diagnosis not present

## 2018-02-09 ENCOUNTER — Ambulatory Visit
Admission: RE | Admit: 2018-02-09 | Discharge: 2018-02-09 | Disposition: A | Discharge status: Home / Self Care | Payer: PPO | Source: Ambulatory Visit | Attending: Family Medicine | Admitting: Family Medicine

## 2018-02-09 DIAGNOSIS — Z1231 Encounter for screening mammogram for malignant neoplasm of breast: Secondary | ICD-10-CM

## 2018-02-09 LAB — HM MAMMOGRAPHY: HM Mammogram: NORMAL (ref 0–4)

## 2018-07-05 DIAGNOSIS — E039 Hypothyroidism, unspecified: Secondary | ICD-10-CM | POA: Diagnosis not present

## 2018-07-06 DIAGNOSIS — E039 Hypothyroidism, unspecified: Secondary | ICD-10-CM | POA: Diagnosis not present

## 2018-10-11 DIAGNOSIS — E039 Hypothyroidism, unspecified: Secondary | ICD-10-CM | POA: Diagnosis not present

## 2018-10-31 DIAGNOSIS — Z23 Encounter for immunization: Secondary | ICD-10-CM | POA: Diagnosis not present

## 2018-10-31 DIAGNOSIS — R4789 Other speech disturbances: Secondary | ICD-10-CM | POA: Diagnosis not present

## 2018-12-12 DIAGNOSIS — E559 Vitamin D deficiency, unspecified: Secondary | ICD-10-CM | POA: Diagnosis not present

## 2018-12-12 DIAGNOSIS — R7309 Other abnormal glucose: Secondary | ICD-10-CM | POA: Diagnosis not present

## 2018-12-12 DIAGNOSIS — Z23 Encounter for immunization: Secondary | ICD-10-CM | POA: Diagnosis not present

## 2018-12-12 DIAGNOSIS — G4700 Insomnia, unspecified: Secondary | ICD-10-CM | POA: Diagnosis not present

## 2018-12-12 DIAGNOSIS — I10 Essential (primary) hypertension: Secondary | ICD-10-CM | POA: Diagnosis not present

## 2018-12-12 DIAGNOSIS — R4789 Other speech disturbances: Secondary | ICD-10-CM | POA: Diagnosis not present

## 2018-12-12 DIAGNOSIS — E039 Hypothyroidism, unspecified: Secondary | ICD-10-CM | POA: Diagnosis not present

## 2018-12-15 DIAGNOSIS — R4789 Other speech disturbances: Secondary | ICD-10-CM | POA: Diagnosis not present

## 2018-12-15 DIAGNOSIS — E039 Hypothyroidism, unspecified: Secondary | ICD-10-CM | POA: Diagnosis not present

## 2018-12-15 DIAGNOSIS — I10 Essential (primary) hypertension: Secondary | ICD-10-CM | POA: Diagnosis not present

## 2018-12-15 DIAGNOSIS — Z23 Encounter for immunization: Secondary | ICD-10-CM | POA: Diagnosis not present

## 2018-12-19 DIAGNOSIS — H6121 Impacted cerumen, right ear: Secondary | ICD-10-CM | POA: Diagnosis not present

## 2018-12-19 DIAGNOSIS — E039 Hypothyroidism, unspecified: Secondary | ICD-10-CM | POA: Diagnosis not present

## 2018-12-19 DIAGNOSIS — I10 Essential (primary) hypertension: Secondary | ICD-10-CM | POA: Diagnosis not present

## 2018-12-19 DIAGNOSIS — Z23 Encounter for immunization: Secondary | ICD-10-CM | POA: Diagnosis not present

## 2019-01-30 DIAGNOSIS — H6121 Impacted cerumen, right ear: Secondary | ICD-10-CM | POA: Diagnosis not present

## 2019-01-30 DIAGNOSIS — I10 Essential (primary) hypertension: Secondary | ICD-10-CM | POA: Diagnosis not present

## 2019-01-30 DIAGNOSIS — R030 Elevated blood-pressure reading, without diagnosis of hypertension: Secondary | ICD-10-CM | POA: Diagnosis not present

## 2019-01-30 DIAGNOSIS — E039 Hypothyroidism, unspecified: Secondary | ICD-10-CM | POA: Diagnosis not present

## 2019-02-06 DIAGNOSIS — R030 Elevated blood-pressure reading, without diagnosis of hypertension: Secondary | ICD-10-CM | POA: Diagnosis not present

## 2019-02-06 DIAGNOSIS — H6121 Impacted cerumen, right ear: Secondary | ICD-10-CM | POA: Diagnosis not present

## 2019-02-06 DIAGNOSIS — I10 Essential (primary) hypertension: Secondary | ICD-10-CM | POA: Diagnosis not present

## 2019-02-06 DIAGNOSIS — E039 Hypothyroidism, unspecified: Secondary | ICD-10-CM | POA: Diagnosis not present

## 2019-02-08 DIAGNOSIS — Z23 Encounter for immunization: Secondary | ICD-10-CM | POA: Diagnosis not present

## 2019-02-15 DIAGNOSIS — Z1211 Encounter for screening for malignant neoplasm of colon: Secondary | ICD-10-CM | POA: Diagnosis not present

## 2019-02-15 DIAGNOSIS — Z1212 Encounter for screening for malignant neoplasm of rectum: Secondary | ICD-10-CM | POA: Diagnosis not present

## 2019-02-15 LAB — COLOGUARD: Cologuard: NEGATIVE

## 2019-03-01 LAB — COLOGUARD: COLOGUARD: NEGATIVE

## 2019-03-14 DIAGNOSIS — G4700 Insomnia, unspecified: Secondary | ICD-10-CM | POA: Diagnosis not present

## 2019-03-14 DIAGNOSIS — H6121 Impacted cerumen, right ear: Secondary | ICD-10-CM | POA: Diagnosis not present

## 2019-03-14 DIAGNOSIS — E039 Hypothyroidism, unspecified: Secondary | ICD-10-CM | POA: Diagnosis not present

## 2019-03-14 DIAGNOSIS — E559 Vitamin D deficiency, unspecified: Secondary | ICD-10-CM | POA: Diagnosis not present

## 2019-03-14 DIAGNOSIS — I10 Essential (primary) hypertension: Secondary | ICD-10-CM | POA: Diagnosis not present

## 2019-03-14 DIAGNOSIS — R7309 Other abnormal glucose: Secondary | ICD-10-CM | POA: Diagnosis not present

## 2019-03-14 DIAGNOSIS — R030 Elevated blood-pressure reading, without diagnosis of hypertension: Secondary | ICD-10-CM | POA: Diagnosis not present

## 2019-03-15 LAB — VITAMIN D 25 HYDROXY (VIT D DEFICIENCY, FRACTURES): Vit D, 25-Hydroxy: 40

## 2019-03-15 LAB — CBC AND DIFFERENTIAL
HCT: 35 — AB (ref 36–46)
Hemoglobin: 11.7 — AB (ref 12.0–16.0)
Neutrophils Absolute: 6
Platelets: 255 (ref 150–399)
WBC: 8.7

## 2019-03-15 LAB — HEPATIC FUNCTION PANEL
ALT: 12 (ref 7–35)
AST: 20 (ref 13–35)
Alkaline Phosphatase: 90 (ref 25–125)
Bilirubin, Total: 0.4

## 2019-03-15 LAB — LIPID PANEL
Cholesterol: 267 — AB (ref 0–200)
HDL: 73 — AB (ref 35–70)
LDL Cholesterol: 178
LDl/HDL Ratio: 2.4
Triglycerides: 80 (ref 40–160)

## 2019-03-15 LAB — COMPREHENSIVE METABOLIC PANEL
Albumin: 4.8 (ref 3.5–5.0)
Calcium: 9.8 (ref 8.7–10.7)

## 2019-03-15 LAB — BASIC METABOLIC PANEL
BUN: 21 (ref 4–21)
CO2: 27 — AB (ref 13–22)
Chloride: 94 — AB (ref 99–108)
Creatinine: 1 (ref 0.5–1.1)
Glucose: 104
Potassium: 3.5 (ref 3.4–5.3)
Sodium: 136 — AB (ref 137–147)

## 2019-03-15 LAB — CBC: RBC: 3.84 — AB (ref 3.87–5.11)

## 2019-03-15 LAB — HEMOGLOBIN A1C: Hemoglobin A1C: 5.6

## 2019-03-15 LAB — TSH: TSH: 0.12 — AB (ref 0.41–5.90)

## 2019-03-22 IMAGING — MG DIGITAL SCREENING BILATERAL MAMMOGRAM WITH TOMO AND CAD
8 series · 8 of 24 positions shown · non-contrast
Comparison: Previous exam(s).

CLINICAL DATA: Screening.

EXAM:
DIGITAL SCREENING BILATERAL MAMMOGRAM WITH TOMO AND CAD

[R MLO synth-2D]
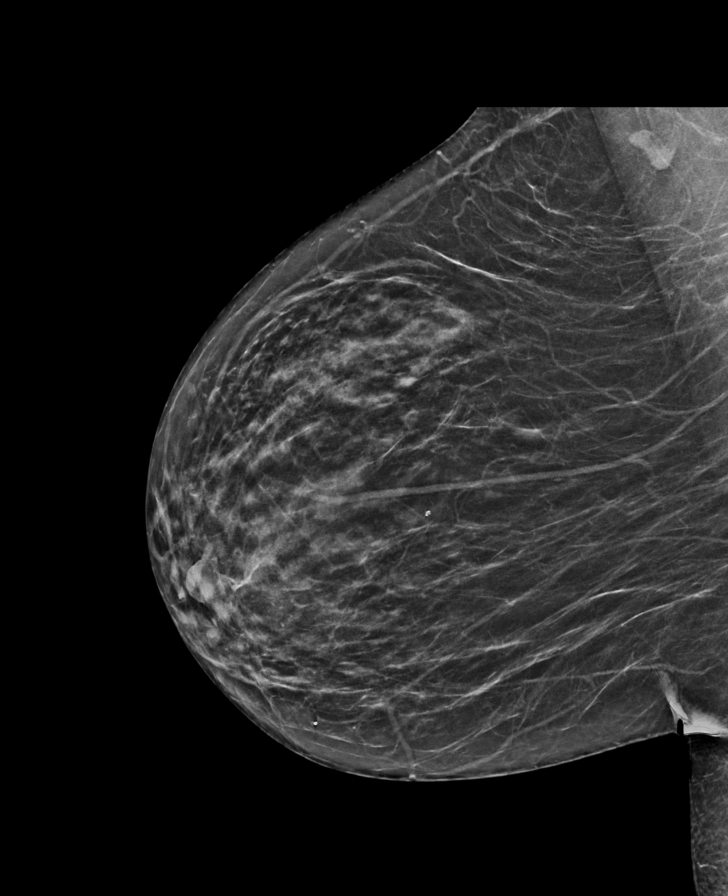

[L CC synth-2D]
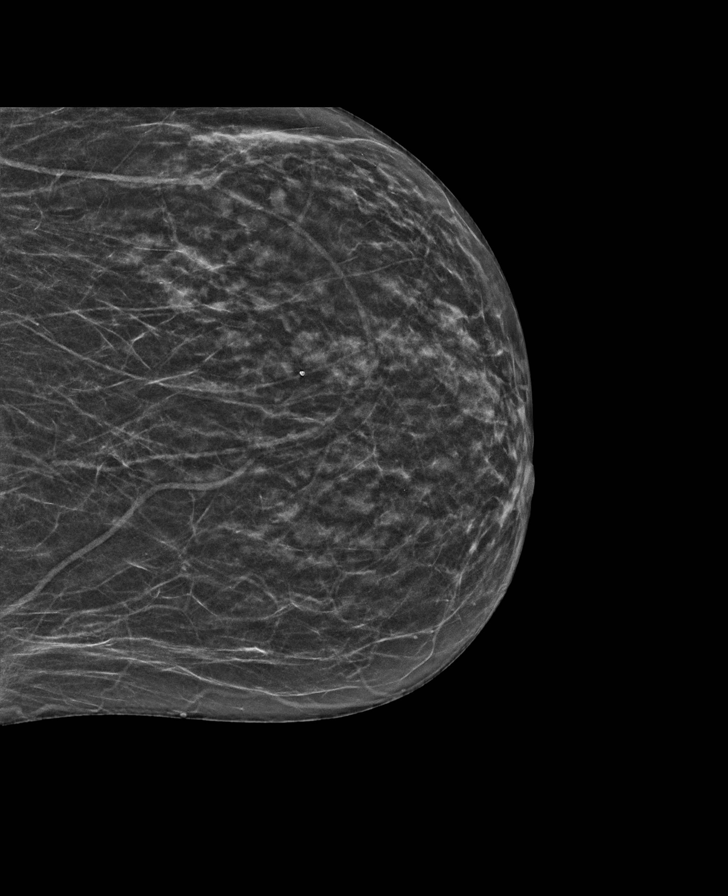

[L MLO synth-2D]
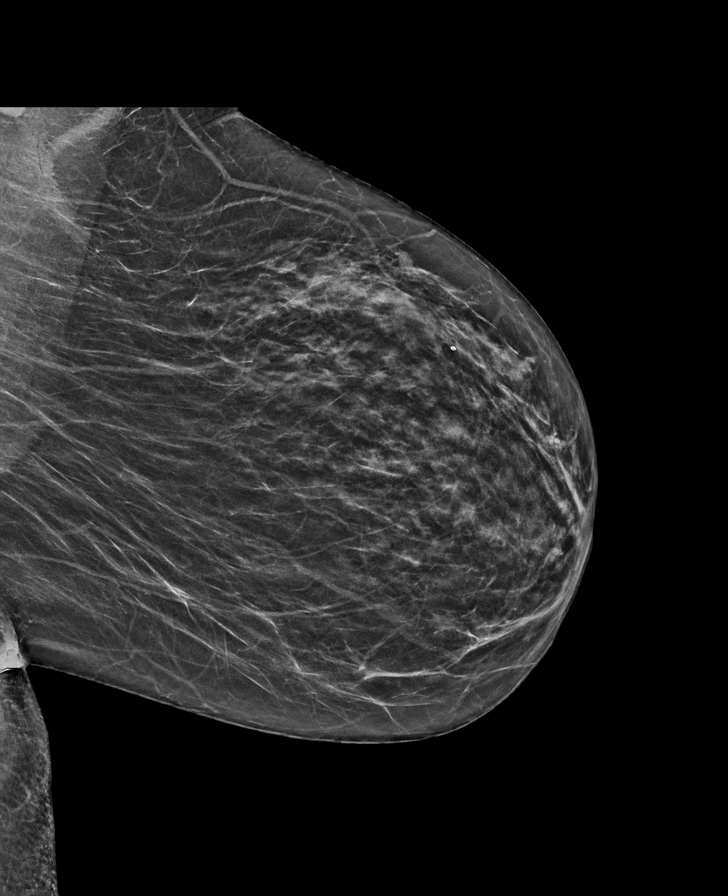

[R CC synth-2D]
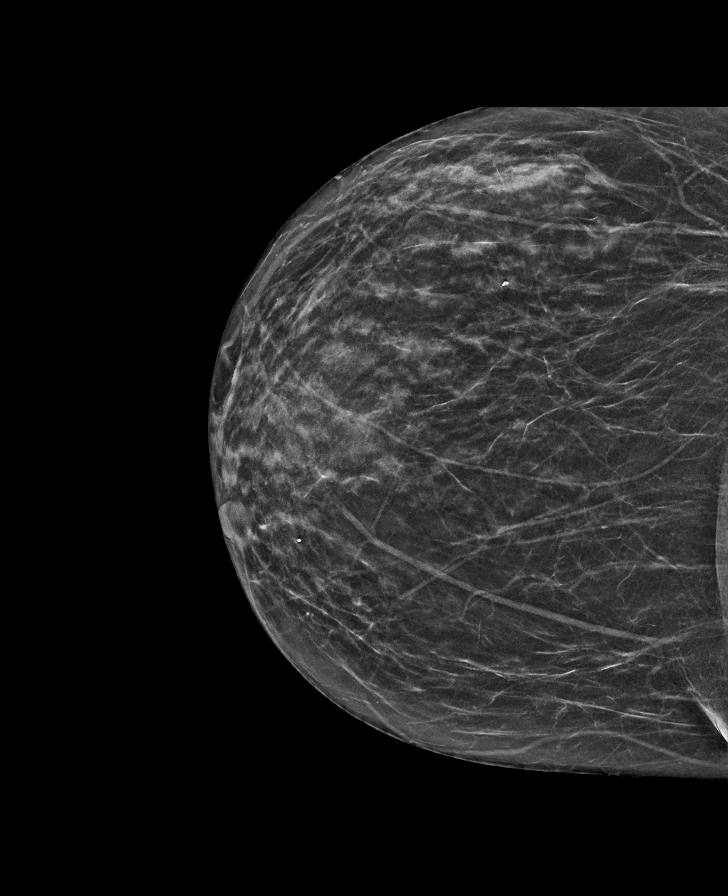

[L MLO tomo · tomo slice 28/55.0]
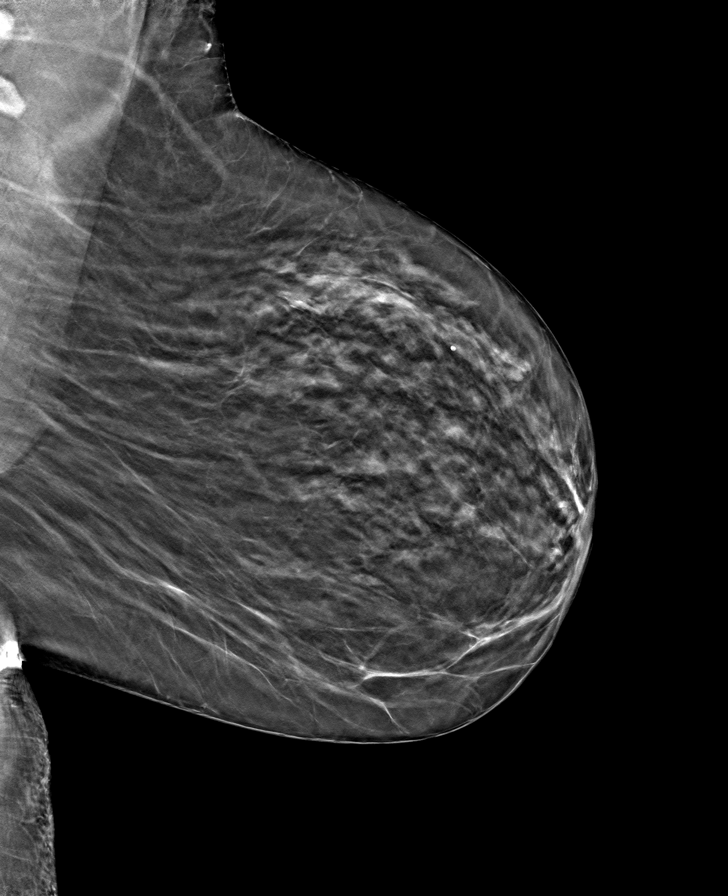

[L CC tomo · tomo slice 24/47.0]
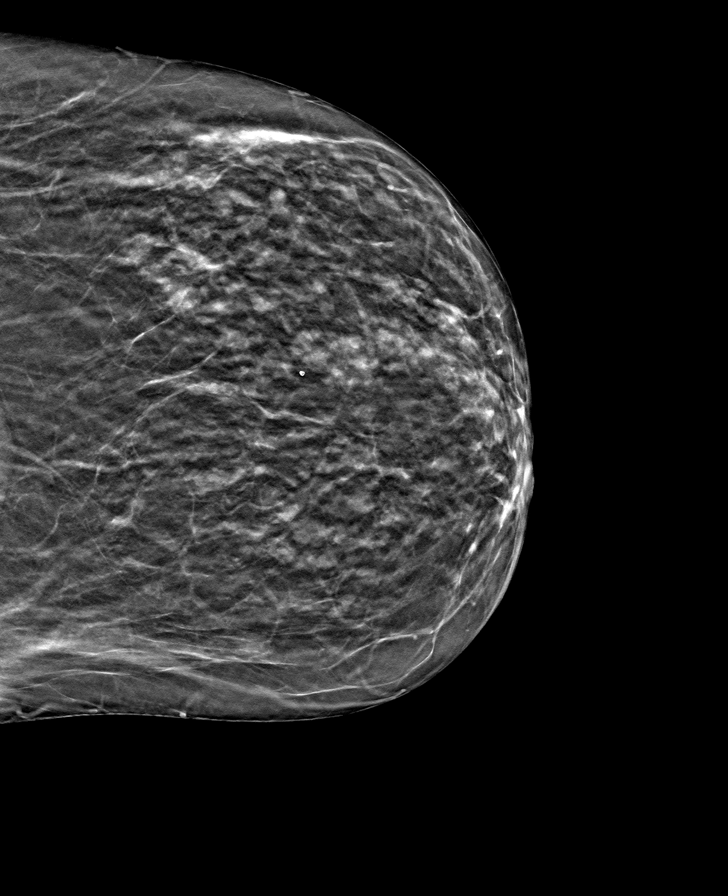

[R CC tomo · tomo slice 25/49.0]
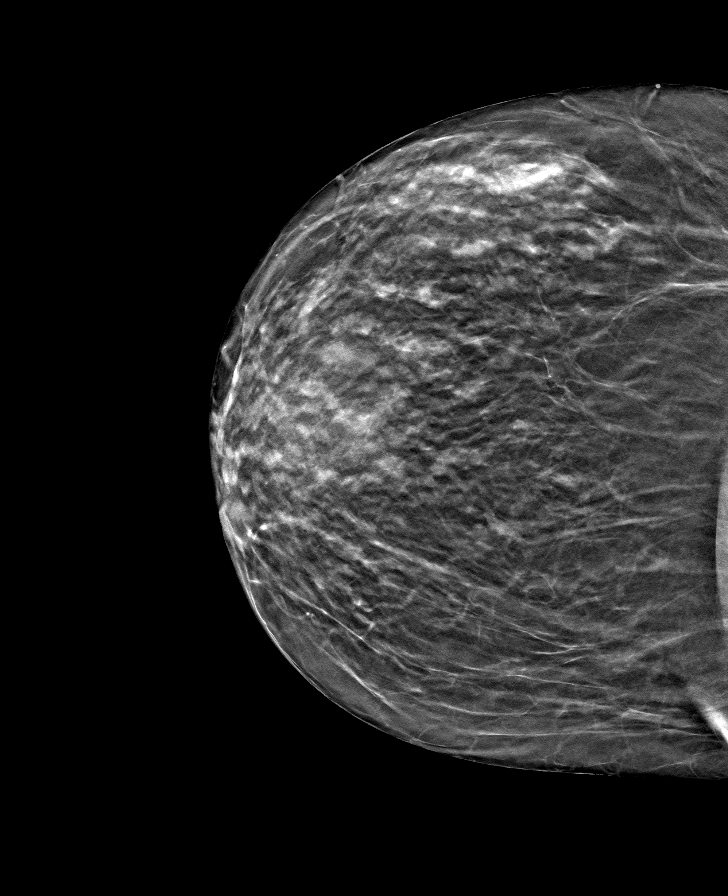

[R MLO tomo · tomo slice 27/54.0]
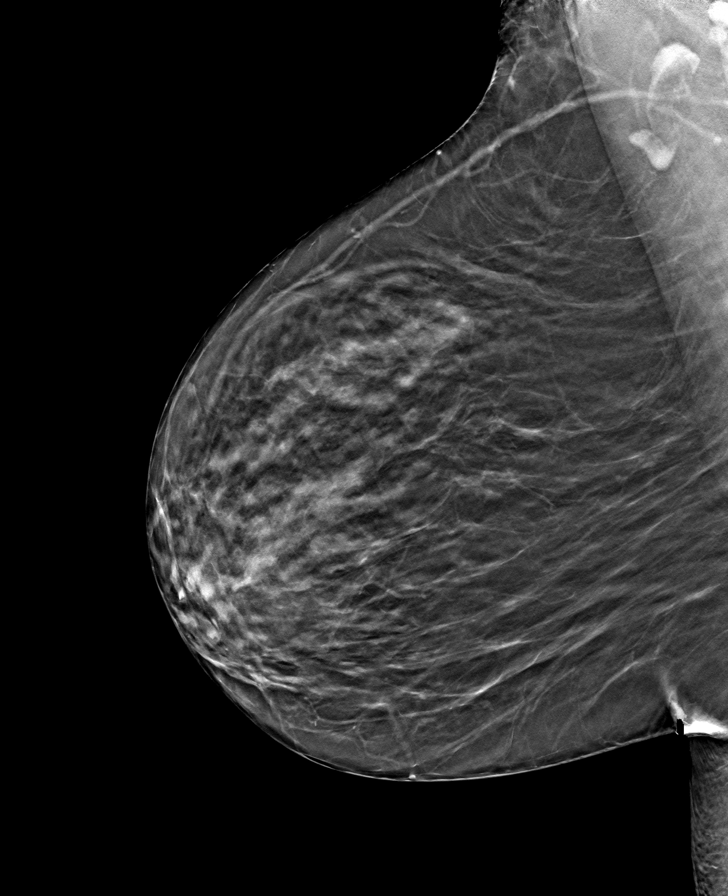

[8 of 24 positions shown; findings below may reference images not displayed]

ACR Breast Density Category c: The breast tissue is heterogeneously
dense, which may obscure small masses.
FINDINGS: There are no findings suspicious for malignancy. Images were
processed with CAD.
IMPRESSION: No mammographic evidence of malignancy. A result letter of this
screening mammogram will be mailed directly to the patient.

RECOMMENDATION:
Screening mammogram in one year. (Code:FT-U-LHB)

BI-RADS CATEGORY  1: Negative.

## 2019-03-29 DIAGNOSIS — E039 Hypothyroidism, unspecified: Secondary | ICD-10-CM | POA: Diagnosis not present

## 2019-03-29 DIAGNOSIS — R030 Elevated blood-pressure reading, without diagnosis of hypertension: Secondary | ICD-10-CM | POA: Diagnosis not present

## 2019-03-29 DIAGNOSIS — I10 Essential (primary) hypertension: Secondary | ICD-10-CM | POA: Diagnosis not present

## 2019-03-29 DIAGNOSIS — K5900 Constipation, unspecified: Secondary | ICD-10-CM | POA: Diagnosis not present

## 2019-08-15 ENCOUNTER — Other Ambulatory Visit: Payer: Self-pay

## 2019-08-15 ENCOUNTER — Encounter: Payer: Self-pay | Admitting: Adult Health

## 2019-08-15 ENCOUNTER — Ambulatory Visit (INDEPENDENT_AMBULATORY_CARE_PROVIDER_SITE_OTHER): Payer: PPO | Admitting: Adult Health

## 2019-08-15 VITALS — BP 146/78 | Temp 98.1°F | Wt 138.0 lb

## 2019-08-15 DIAGNOSIS — E2839 Other primary ovarian failure: Secondary | ICD-10-CM | POA: Diagnosis not present

## 2019-08-15 DIAGNOSIS — I10 Essential (primary) hypertension: Secondary | ICD-10-CM

## 2019-08-15 DIAGNOSIS — Z7689 Persons encountering health services in other specified circumstances: Secondary | ICD-10-CM | POA: Diagnosis not present

## 2019-08-15 DIAGNOSIS — E039 Hypothyroidism, unspecified: Secondary | ICD-10-CM

## 2019-08-15 NOTE — Patient Instructions (Addendum)
Follow up in November for physical exam   I will follow up with you regarding your lab work   Please call and schedule your mammogram and bone density screen

## 2019-08-15 NOTE — Progress Notes (Addendum)
Patient presents to clinic today to establish care. She is a pleasant 74 year old female who  has a past medical history of Hypertension and Hypothyroidism.  She is a previous patient of Dr. Nadyne Coombes  She believes that her last CPE was in November 2020.   Acute Concerns: Establish Care  Chronic Issues: Hypothyroidism -currently prescribed Armour Thyroid.  She has tried Synthroid in the past on multiple occasions and reports that the Synthroid made her feel not well, she had stiffness in her muscles no fatigue.  She does report that her TSH levels have been somewhat erratic over the last 6 months.  She is currently taking 60 mcg 3 times three times a week, 90 mcg 3 times a week, and 75 mcg once a week - she feels well controlled on this schedule.   Essential hypertension - currently prescribed HCTZ/Losartan - this was added recently. She has not been monitoring her blood pressure at home but does have a cuff.   BP Readings from Last 3 Encounters:  08/15/19 (!) 146/78  11/16/11 (!) 159/79  10/20/11 (!) 170/94   Health Maintenance: Dental -- Routine Care  Vision -- Routine Care - Dr. Burgess Estelle at [facility]  Immunizations -- UTD  Colonoscopy -- Had cologuard done in 2021 - negative  Mammogram --02/2018 Bone Density -- Never had    Past Medical History:  Diagnosis Date  . Hypertension   . Hypothyroidism     Past Surgical History:  Procedure Laterality Date  . CATARACT EXTRACTION, BILATERAL  2016  . HAND SURGERY      Current Outpatient Medications on File Prior to Visit  Medication Sig Dispense Refill  . losartan-hydrochlorothiazide (HYZAAR) 50-12.5 MG tablet Take 1 tablet by mouth daily.    Marland Kitchen thyroid (ARMOUR) 30 MG tablet 2 (two) times daily. 60 mg three times weekly, 90 mg three times weekly and 75 mg once weekly.     No current facility-administered medications on file prior to visit.    Allergies  Allergen Reactions  . Aspirin Hives  . Keflex  [Cephalexin] Hives  . Penicillins Hives    No family history on file.  Social History   Socioeconomic History  . Marital status: Married    Spouse name: Not on file  . Number of children: Not on file  . Years of education: Not on file  . Highest education level: Not on file  Occupational History  . Not on file  Tobacco Use  . Smoking status: Never Smoker  . Smokeless tobacco: Never Used  Vaping Use  . Vaping Use: Never used  Substance and Sexual Activity  . Alcohol use: Yes    Comment: Twice yearly  . Drug use: Not on file  . Sexual activity: Not on file  Other Topics Concern  . Not on file  Social History Narrative  . Not on file   Social Determinants of Health   Financial Resource Strain:   . Difficulty of Paying Living Expenses:   Food Insecurity:   . Worried About Programme researcher, broadcasting/film/video in the Last Year:   . Barista in the Last Year:   Transportation Needs:   . Freight forwarder (Medical):   Marland Kitchen Lack of Transportation (Non-Medical):   Physical Activity:   . Days of Exercise per Week:   . Minutes of Exercise per Session:   Stress:   . Feeling of Stress :   Social Connections:   . Frequency of Communication  with Friends and Family:   . Frequency of Social Gatherings with Friends and Family:   . Attends Religious Services:   . Active Member of Clubs or Organizations:   . Attends Banker Meetings:   Marland Kitchen Marital Status:   Intimate Partner Violence:   . Fear of Current or Ex-Partner:   . Emotionally Abused:   Marland Kitchen Physically Abused:   . Sexually Abused:     Review of Systems  Constitutional: Negative.   HENT: Negative.   Eyes: Negative.   Respiratory: Negative.   Cardiovascular: Negative.   Gastrointestinal: Negative.   Genitourinary: Negative.   Musculoskeletal: Negative.   Skin: Negative.   Neurological: Negative.   Endo/Heme/Allergies: Negative.   Psychiatric/Behavioral: Negative.   All other systems reviewed and are  negative.   BP (!) 146/78   Temp 98.1 F (36.7 C)   Wt 138 lb (62.6 kg)   BMI 23.69 kg/m   Physical Exam Vitals and nursing note reviewed.  Constitutional:      General: She is not in acute distress.    Appearance: Normal appearance. She is well-developed. She is not ill-appearing.  HENT:     Head: Normocephalic and atraumatic.     Right Ear: Tympanic membrane, ear canal and external ear normal. There is no impacted cerumen.     Left Ear: Tympanic membrane, ear canal and external ear normal. There is no impacted cerumen.     Nose: Nose normal. No congestion or rhinorrhea.     Mouth/Throat:     Mouth: Mucous membranes are moist.     Pharynx: Oropharynx is clear. No oropharyngeal exudate or posterior oropharyngeal erythema.  Eyes:     General:        Right eye: No discharge.        Left eye: No discharge.     Extraocular Movements: Extraocular movements intact.     Conjunctiva/sclera: Conjunctivae normal.     Pupils: Pupils are equal, round, and reactive to light.  Neck:     Thyroid: No thyromegaly.     Vascular: No carotid bruit.     Trachea: No tracheal deviation.  Cardiovascular:     Rate and Rhythm: Normal rate and regular rhythm.     Pulses: Normal pulses.     Heart sounds: Normal heart sounds. No murmur heard.  No friction rub. No gallop.   Pulmonary:     Effort: Pulmonary effort is normal. No respiratory distress.     Breath sounds: Normal breath sounds. No stridor. No wheezing, rhonchi or rales.  Chest:     Chest wall: No tenderness.  Abdominal:     General: Abdomen is flat. Bowel sounds are normal. There is no distension.     Palpations: Abdomen is soft. There is no mass.     Tenderness: There is no abdominal tenderness. There is no right CVA tenderness, left CVA tenderness, guarding or rebound.     Hernia: No hernia is present.  Musculoskeletal:        General: No swelling, tenderness, deformity or signs of injury. Normal range of motion.     Cervical back:  Normal range of motion and neck supple.     Right lower leg: No edema.     Left lower leg: No edema.  Lymphadenopathy:     Cervical: No cervical adenopathy.  Skin:    General: Skin is warm and dry.     Coloration: Skin is not jaundiced or pale.     Findings: No bruising,  erythema, lesion or rash.  Neurological:     General: No focal deficit present.     Mental Status: She is alert and oriented to person, place, and time.     Cranial Nerves: No cranial nerve deficit.     Sensory: No sensory deficit.     Motor: No weakness.     Coordination: Coordination normal.     Gait: Gait normal.     Deep Tendon Reflexes: Reflexes normal.  Psychiatric:        Mood and Affect: Mood normal.        Behavior: Behavior normal.        Thought Content: Thought content normal.        Judgment: Judgment normal.      Assessment/Plan: 1. Encounter to establish care - Follow up in November for CPE or sooner if needed - Continue to exercise and eat healthy  2. Acquired hypothyroidism - Consider dose change of synthroid  - TSH - T4, Free - T3, Free  3. Essential hypertension - Advised to monitor BP at home. Bring log to next appointment   4. Estrogen deficiency  - MM DIGITAL SCREENING BILATERAL; Future - DG Bone Density; Future  Shirline Frees, NP

## 2019-08-16 ENCOUNTER — Encounter: Payer: Self-pay | Admitting: Adult Health

## 2019-08-16 ENCOUNTER — Encounter: Payer: Self-pay | Admitting: Family Medicine

## 2019-08-16 LAB — T4, FREE: Free T4: 1 ng/dL (ref 0.8–1.8)

## 2019-08-16 LAB — TSH: TSH: 0.11 mIU/L — ABNORMAL LOW (ref 0.40–4.50)

## 2019-08-16 LAB — T3, FREE: T3, Free: 3.9 pg/mL (ref 2.3–4.2)

## 2019-08-16 NOTE — Addendum Note (Signed)
Addended by: Nancy Fetter on: 08/16/2019 09:59 AM   Modules accepted: Orders

## 2019-08-17 ENCOUNTER — Encounter: Payer: Self-pay | Admitting: Family Medicine

## 2019-09-28 ENCOUNTER — Other Ambulatory Visit: Payer: PPO

## 2019-10-02 ENCOUNTER — Other Ambulatory Visit: Payer: Self-pay | Admitting: Adult Health

## 2019-10-22 ENCOUNTER — Other Ambulatory Visit: Payer: Self-pay | Admitting: *Deleted

## 2019-10-22 ENCOUNTER — Telehealth: Payer: Self-pay | Admitting: Adult Health

## 2019-10-22 DIAGNOSIS — E039 Hypothyroidism, unspecified: Secondary | ICD-10-CM

## 2019-10-22 NOTE — Telephone Encounter (Signed)
Pt call and stated she need a refill on thyroid (ARMOUR) 30 MG tablet sent to [facility] she stated is leaving for the beach on the 24 and would like to have it before that date. v

## 2019-10-22 NOTE — Telephone Encounter (Signed)
Okay to fill as rx'd by historical provider

## 2019-10-23 MED ORDER — THYROID 30 MG PO TABS: 30.0000 mg | ORAL_TABLET | Freq: Two times a day (BID) | ORAL | 0 refills | Status: DC

## 2019-10-25 ENCOUNTER — Telehealth: Payer: Self-pay | Admitting: Adult Health

## 2019-10-25 NOTE — Telephone Encounter (Signed)
Pt has been trying to get a refill on Armour Thyroid.  Patient is leaving for the beach on Saturday so she needs it byad tomorrow.    Pharmacy- [facility]

## 2019-10-25 NOTE — Telephone Encounter (Signed)
Pt Rx for thyroid was sent  to her pharmacy

## 2019-11-12 ENCOUNTER — Telehealth: Payer: Self-pay | Admitting: Adult Health

## 2019-11-12 NOTE — Telephone Encounter (Signed)
Patient has been trying to get the right prescription for her thyroid medicine since 10/11/19.  She has been taking inaccurate prescription due to "incooperation" on our office's part described by the patient.    Correct prescription needs to be the following--  Armour 15 mg AND Armour 30 mg called in to [facility] on [address].  She takes both of these daily and the tablets cannot be cut, they shatter when they are cut in half.  **[facility]**

## 2019-11-13 MED ORDER — THYROID 15 MG PO TABS: ORAL_TABLET | ORAL | 0 refills | Status: DC

## 2019-11-13 MED ORDER — THYROID 30 MG PO TABS: ORAL_TABLET | ORAL | 0 refills | Status: DC

## 2019-11-13 NOTE — Telephone Encounter (Signed)
I left a detailed message at the pts cell number the Rxs were sent to [facility].  [facility].

## 2019-11-13 NOTE — Telephone Encounter (Signed)
Ok to send in prescriptions. 

## 2019-11-30 DIAGNOSIS — H35413 Lattice degeneration of retina, bilateral: Secondary | ICD-10-CM | POA: Diagnosis not present

## 2019-11-30 DIAGNOSIS — H18593 Other hereditary corneal dystrophies, bilateral: Secondary | ICD-10-CM | POA: Diagnosis not present

## 2019-11-30 DIAGNOSIS — H04123 Dry eye syndrome of bilateral lacrimal glands: Secondary | ICD-10-CM | POA: Diagnosis not present

## 2019-11-30 DIAGNOSIS — H524 Presbyopia: Secondary | ICD-10-CM | POA: Diagnosis not present

## 2019-12-20 ENCOUNTER — Ambulatory Visit (INDEPENDENT_AMBULATORY_CARE_PROVIDER_SITE_OTHER): Payer: PPO | Admitting: Adult Health

## 2019-12-20 ENCOUNTER — Other Ambulatory Visit: Payer: Self-pay

## 2019-12-20 ENCOUNTER — Other Ambulatory Visit: Payer: Self-pay | Admitting: Adult Health

## 2019-12-20 ENCOUNTER — Encounter: Payer: Self-pay | Admitting: Adult Health

## 2019-12-20 DIAGNOSIS — Z0000 Encounter for general adult medical examination without abnormal findings: Secondary | ICD-10-CM

## 2019-12-20 DIAGNOSIS — E039 Hypothyroidism, unspecified: Secondary | ICD-10-CM

## 2019-12-20 DIAGNOSIS — Z0001 Encounter for general adult medical examination with abnormal findings: Secondary | ICD-10-CM | POA: Diagnosis not present

## 2019-12-20 DIAGNOSIS — I10 Essential (primary) hypertension: Secondary | ICD-10-CM | POA: Diagnosis not present

## 2019-12-20 DIAGNOSIS — M6740 Ganglion, unspecified site: Secondary | ICD-10-CM

## 2019-12-20 DIAGNOSIS — R252 Cramp and spasm: Secondary | ICD-10-CM

## 2019-12-20 LAB — LIPID PANEL
Cholesterol: 204 mg/dL — ABNORMAL HIGH (ref 0–200)
HDL: 75 mg/dL (ref >39.00)
LDL Cholesterol: 118 mg/dL — ABNORMAL HIGH (ref 0–99)
NonHDL: 129.19
Total CHOL/HDL Ratio: 3
Triglycerides: 55 mg/dL (ref 0.0–149.0)
VLDL: 11 mg/dL (ref 0.0–40.0)

## 2019-12-20 LAB — COMPREHENSIVE METABOLIC PANEL
ALT: 11 U/L (ref 0–35)
AST: 18 U/L (ref 0–37)
Albumin: 4.5 g/dL (ref 3.5–5.2)
Alkaline Phosphatase: 74 U/L (ref 39–117)
BUN: 22 mg/dL (ref 6–23)
CO2: 28 mEq/L (ref 19–32)
Calcium: 10 mg/dL (ref 8.4–10.5)
Chloride: 99 mEq/L (ref 96–112)
Creatinine, Ser: 1.14 mg/dL (ref 0.40–1.20)
GFR: 48.1 mL/min — ABNORMAL LOW (ref >60.00)
Glucose, Bld: 105 mg/dL — ABNORMAL HIGH (ref 70–99)
Potassium: 4.1 mEq/L (ref 3.5–5.1)
Sodium: 137 mEq/L (ref 135–145)
Total Bilirubin: 0.4 mg/dL (ref 0.2–1.2)
Total Protein: 7.2 g/dL (ref 6.0–8.3)

## 2019-12-20 LAB — CBC WITH DIFFERENTIAL/PLATELET
Basophils Absolute: 0 10*3/uL (ref 0.0–0.1)
Basophils Relative: 0.6 % (ref 0.0–3.0)
Eosinophils Absolute: 0.2 10*3/uL (ref 0.0–0.7)
Eosinophils Relative: 2.5 % (ref 0.0–5.0)
HCT: 35.1 % — ABNORMAL LOW (ref 36.0–46.0)
Hemoglobin: 11.7 g/dL — ABNORMAL LOW (ref 12.0–15.0)
Lymphocytes Relative: 17.8 % (ref 12.0–46.0)
Lymphs Abs: 1.3 10*3/uL (ref 0.7–4.0)
MCHC: 33.4 g/dL (ref 30.0–36.0)
MCV: 89.9 fl (ref 78.0–100.0)
Monocytes Absolute: 0.7 10*3/uL (ref 0.1–1.0)
Monocytes Relative: 9.2 % (ref 3.0–12.0)
Neutro Abs: 5 10*3/uL (ref 1.4–7.7)
Neutrophils Relative %: 69.9 % (ref 43.0–77.0)
Platelets: 231 10*3/uL (ref 150.0–400.0)
RBC: 3.9 Mil/uL (ref 3.87–5.11)
RDW: 13.1 % (ref 11.5–15.5)
WBC: 7.1 10*3/uL (ref 4.0–10.5)

## 2019-12-20 LAB — B12 AND FOLATE PANEL
Folate: 5.9 ng/mL — ABNORMAL LOW (ref >5.9)
Vitamin B-12: 215 pg/mL (ref 211–911)

## 2019-12-20 LAB — T4, FREE: Free T4: 0.89 ng/dL (ref 0.60–1.60)

## 2019-12-20 LAB — T3, FREE: T3, Free: 4.6 pg/mL — ABNORMAL HIGH (ref 2.3–4.2)

## 2019-12-20 LAB — TSH: TSH: 0.06 u[IU]/mL — ABNORMAL LOW (ref 0.35–4.50)

## 2019-12-20 LAB — HEMOGLOBIN A1C: Hgb A1c MFr Bld: 5.7 % (ref 4.6–6.5)

## 2019-12-20 NOTE — Progress Notes (Signed)
Subjective:    Patient ID: Pamela Kim, female    DOB: [birth year], 74 y.o.   MRN: [id number]  HPI  Patient presents for yearly preventative medicine examination. She is a pleasant 74 year old female who  has a past medical history of Hypertension and Hypothyroidism.   Essential Hypertension -she is currently prescribed Hyzaar 50-12.5 mg daily.  She has not been checking her blood pressure at home.  Took her blood pressure medication on the way to her appointment this morning BP Readings from Last 3 Encounters:  08/15/19 (!) 146/78  11/16/11 (!) 159/79  10/20/11 (!) 170/94   Hypothyroidism -she is currently prescribed Armour Thyroid. She has tried Synthroid in the past on multiple occasions and reports that Synthroid made her not feel well, she had stiffness in her muscles and fatigue. With her previous PCP her TSH levels have been somewhat erratic over the previous 6 months. She is currently prescribed  Armour Thyroid 60 mcg 3 times a week, 90 mcg 3 times a week and 75 mcg once a week. Over the last few weeks she has taken it upon herself to adjust her medication on her own. She is currently taking :  She is taking 75 mcg 3 x a week and 90 mcg 4 times a week   Other Issues - Ganglion Cyst -has a mass at the base of her right thumb Reports that she has pain with certain motions such as pinching motion when she is working in the garden.  She is interested in seeing a hand surgeon  Cramping in her feet-this has been an issue for many years.  Reports when she flexes her foot upwards that it will cramp and freeze time to time.  Has been seen by other providers for this issue and reports "they were not impressed and really did not do anything".   All immunizations and health maintenance protocols were reviewed with the patient and needed orders were placed.  Appropriate screening laboratory values were ordered for the patient including screening of hyperlipidemia, renal function and  hepatic function.  Medication reconciliation,  past medical history, social history, problem list and allergies were reviewed in detail with the patient  Goals were established with regard to weight loss, exercise, and  diet in compliance with medications  She is up-to-date on dental and vision care. She had a negative Cologuard done in 2021. Her last mammogram was in January 2020  Review of Systems  Constitutional: Negative.   HENT: Negative.   Eyes: Negative.   Respiratory: Negative.   Cardiovascular: Negative.   Gastrointestinal: Negative.   Endocrine: Negative.   Genitourinary: Negative.   Musculoskeletal: Positive for arthralgias and joint swelling.  Skin: Negative.   Allergic/Immunologic: Negative.   Neurological: Negative.   Hematological: Negative.   Psychiatric/Behavioral: Negative.    Past Medical History:  Diagnosis Date  . Hypertension   . Hypothyroidism     Social History   Socioeconomic History  . Marital status: Married    Spouse name: Not on file  . Number of children: Not on file  . Years of education: Not on file  . Highest education level: Not on file  Occupational History  . Not on file  Tobacco Use  . Smoking status: Never Smoker  . Smokeless tobacco: Never Used  Vaping Use  . Vaping Use: Never used  Substance and Sexual Activity  . Alcohol use: Yes    Comment: Twice yearly  . Drug use: Not on file  .  Sexual activity: Not on file  Other Topics Concern  . Not on file  Social History Narrative  . Not on file   Social Determinants of Health   Financial Resource Strain:   . Difficulty of Paying Living Expenses: Not on file  Food Insecurity:   . Worried About Programme researcher, broadcasting/film/video in the Last Year: Not on file  . Ran Out of Food in the Last Year: Not on file  Transportation Needs:   . Lack of Transportation (Medical): Not on file  . Lack of Transportation (Non-Medical): Not on file  Physical Activity:   . Days of Exercise per Week: Not  on file  . Minutes of Exercise per Session: Not on file  Stress:   . Feeling of Stress : Not on file  Social Connections:   . Frequency of Communication with Friends and Family: Not on file  . Frequency of Social Gatherings with Friends and Family: Not on file  . Attends Religious Services: Not on file  . Active Member of Clubs or Organizations: Not on file  . Attends Banker Meetings: Not on file  . Marital Status: Not on file  Intimate Partner Violence:   . Fear of Current or Ex-Partner: Not on file  . Emotionally Abused: Not on file  . Physically Abused: Not on file  . Sexually Abused: Not on file    Past Surgical History:  Procedure Laterality Date  . CATARACT EXTRACTION, BILATERAL  2016  . HAND SURGERY      Family History  Problem Relation Age of Onset  . Asthma Mother   . Mental illness Mother   . Graves' disease Mother   . COPD Mother   . Heart murmur Father   . Learning disabilities Sister   . Hypothyroidism Sister   . Hypothyroidism Sister     Allergies  Allergen Reactions  . Aspirin Hives  . Keflex [Cephalexin] Hives  . Penicillins Hives    Current Outpatient Medications on File Prior to Visit  Medication Sig Dispense Refill  . losartan-hydrochlorothiazide (HYZAAR) 50-12.5 MG tablet TAKE 1 TABLET BY MOUTH EVERY DAY 90 tablet 0  . Omega 3 1000 MG CAPS Take 2,000 mg by mouth daily.    Marland Kitchen thyroid (ARMOUR THYROID) 15 MG tablet Take 75mg  by mouth weekly 60 tablet 0  . thyroid (ARMOUR) 30 MG tablet Take 60mg  by mouth three times weekly and 90mg  three times weekly 180 tablet 0  . VITAMIN A PO Take 10,000 Units by mouth daily.    VITAMIN D PO Take 4,000 Units by mouth daily.    VITAMIN K PO Take 120 mcg by mouth daily.     No current facility-administered medications on file prior to visit.    There were no vitals taken for this visit.      Objective:   Physical Exam Vitals and nursing note reviewed.  Constitutional:      General: She  is not in acute distress.    Appearance: Normal appearance. She is well-developed. She is not ill-appearing.  HENT:     Head: Normocephalic and atraumatic.     Right Ear: Tympanic membrane, ear canal and external ear normal. There is no impacted cerumen.     Left Ear: Tympanic membrane, ear canal and external ear normal. There is no impacted cerumen.     Nose: Nose normal. No congestion or rhinorrhea.     Mouth/Throat:     Mouth: Mucous membranes are moist.  Pharynx: Oropharynx is clear. No oropharyngeal exudate or posterior oropharyngeal erythema.  Eyes:     General:        Right eye: No discharge.        Left eye: No discharge.     Extraocular Movements: Extraocular movements intact.     Conjunctiva/sclera: Conjunctivae normal.     Pupils: Pupils are equal, round, and reactive to light.  Neck:     Thyroid: No thyromegaly.     Vascular: No carotid bruit.     Trachea: No tracheal deviation.  Cardiovascular:     Rate and Rhythm: Normal rate and regular rhythm.     Pulses: Normal pulses.     Heart sounds: Normal heart sounds. No murmur heard.  No friction rub. No gallop.   Pulmonary:     Effort: Pulmonary effort is normal. No respiratory distress.     Breath sounds: Normal breath sounds. No stridor. No wheezing, rhonchi or rales.  Chest:     Chest wall: No tenderness.  Abdominal:     General: Abdomen is flat. Bowel sounds are normal. There is no distension.     Palpations: Abdomen is soft. There is no mass.     Tenderness: There is no abdominal tenderness. There is no right CVA tenderness, left CVA tenderness, guarding or rebound.     Hernia: No hernia is present.  Musculoskeletal:        General: No swelling, tenderness, deformity or signs of injury. Normal range of motion.     Cervical back: Normal range of motion and neck supple.     Right lower leg: No edema.     Left lower leg: No edema.     Comments:  ganglion cyst at the carpal metacarpal joint of her right  hand.  When she flexes her right foot upwards it does cramp and freeze momentarily.  Lymphadenopathy:     Cervical: No cervical adenopathy.  Skin:    General: Skin is warm and dry.     Coloration: Skin is not jaundiced or pale.     Findings: No bruising, erythema, lesion or rash.  Neurological:     General: No focal deficit present.     Mental Status: She is alert and oriented to person, place, and time.     Cranial Nerves: No cranial nerve deficit.     Sensory: No sensory deficit.     Motor: No weakness.     Coordination: Coordination normal.     Gait: Gait normal.     Deep Tendon Reflexes: Reflexes normal.  Psychiatric:        Mood and Affect: Mood normal.        Behavior: Behavior normal.        Thought Content: Thought content normal.        Judgment: Judgment normal.       Assessment & Plan:  1. Routine general medical examination at a health care facility -Encouraged heart healthy diet and frequent exercise. - Comprehensive metabolic panel; Future - B12 and Folate Panel; Future - TSH; Future - Lipid panel; Future - Hemoglobin A1c; Future - CBC with Differential/Platelet; Future - CBC with Differential/Platelet - Hemoglobin A1c - Lipid panel - TSH - B12 and Folate Panel - Comprehensive metabolic panel  2. Acquired hypothyroidism -Advised against changing her dose on her own.  Will check TSH today, she is likely on too much Armour Thyroid and this will need to be adjusted - T4, Free; Future - T3, Free; Future -  TSH; Future - Lipid panel; Future - Hemoglobin A1c; Future - CBC with Differential/Platelet; Future - CBC with Differential/Platelet - Hemoglobin A1c - Lipid panel - TSH - T3, Free - T4, Free  3. Essential hypertension -Encouraged to monitor her blood pressure at home and follow-up in 2 weeks with results - Comprehensive metabolic panel; Future - TSH; Future - Lipid panel; Future - Hemoglobin A1c; Future - CBC with Differential/Platelet;  Future - CBC with Differential/Platelet - Hemoglobin A1c - Lipid panel - TSH - Comprehensive metabolic panel  4. Ganglion cyst  - Ambulatory referral to Hand Surgery  5. Cramping of feet - unknown cause, will check B12 and Folate  - B12 and Folate Panel; Future  Shirline Frees, NP

## 2019-12-20 NOTE — Patient Instructions (Signed)
It was great seeing you today   We will follow up with you regarding your blood work   Please make sure you monitor your blood pressure at home and report back to me in 2 weeks with your blood pressure reading

## 2019-12-21 ENCOUNTER — Other Ambulatory Visit: Payer: Self-pay | Admitting: Adult Health

## 2020-01-08 ENCOUNTER — Other Ambulatory Visit: Payer: Self-pay | Admitting: Adult Health

## 2020-01-08 MED ORDER — LOSARTAN POTASSIUM-HCTZ 50-12.5 MG PO TABS
1.0000 | ORAL_TABLET | Freq: Every day | ORAL | 0 refills | Status: DC
Start: 2020-01-08 — End: 2020-04-02

## 2020-01-08 NOTE — Telephone Encounter (Signed)
Patient is calling and requesting a refill for losartan-hydrochlorothiazide (HYZAAR) 50-12.5 MG tablet sent to [id number] -   [address] [place] [address]  Phone:  [phone] Fax:  [phone] CB is [phone]

## 2020-01-08 NOTE — Telephone Encounter (Signed)
Rx done. 

## 2020-01-22 ENCOUNTER — Other Ambulatory Visit: Payer: PPO

## 2020-01-22 ENCOUNTER — Other Ambulatory Visit: Payer: Self-pay

## 2020-01-22 DIAGNOSIS — E039 Hypothyroidism, unspecified: Secondary | ICD-10-CM

## 2020-01-23 LAB — TSH: TSH: 0.1 mIU/L — ABNORMAL LOW (ref 0.40–4.50)

## 2020-01-29 ENCOUNTER — Other Ambulatory Visit: Payer: Self-pay | Admitting: Adult Health

## 2020-01-29 NOTE — Addendum Note (Signed)
Addended by: Johnella Moloney on: 01/29/2020 08:51 AM   Modules accepted: Orders

## 2020-02-15 DIAGNOSIS — H26491 Other secondary cataract, right eye: Secondary | ICD-10-CM | POA: Diagnosis not present

## 2020-02-15 DIAGNOSIS — H43811 Vitreous degeneration, right eye: Secondary | ICD-10-CM | POA: Diagnosis not present

## 2020-02-19 ENCOUNTER — Other Ambulatory Visit: Payer: Self-pay | Admitting: Adult Health

## 2020-02-19 DIAGNOSIS — E039 Hypothyroidism, unspecified: Secondary | ICD-10-CM

## 2020-02-19 NOTE — Progress Notes (Signed)
t

## 2020-03-04 ENCOUNTER — Other Ambulatory Visit (INDEPENDENT_AMBULATORY_CARE_PROVIDER_SITE_OTHER): Payer: PPO

## 2020-03-04 ENCOUNTER — Other Ambulatory Visit: Payer: Self-pay

## 2020-03-04 ENCOUNTER — Other Ambulatory Visit: Payer: Self-pay | Admitting: Adult Health

## 2020-03-04 DIAGNOSIS — E039 Hypothyroidism, unspecified: Secondary | ICD-10-CM

## 2020-03-04 LAB — TSH: TSH: 1.38 u[IU]/mL (ref 0.35–4.50)

## 2020-03-04 MED ORDER — THYROID 60 MG PO TABS: 60.0000 mg | ORAL_TABLET | Freq: Every day | ORAL | 2 refills | Status: DC

## 2020-03-06 DIAGNOSIS — H26491 Other secondary cataract, right eye: Secondary | ICD-10-CM | POA: Diagnosis not present

## 2020-03-20 ENCOUNTER — Telehealth: Payer: Self-pay | Admitting: Adult Health

## 2020-03-20 NOTE — Telephone Encounter (Signed)
Patient called back--declined wanting an AWV.

## 2020-03-20 NOTE — Telephone Encounter (Signed)
Left message for patient to call back and schedule Medicare Annual Wellness Visit (AWV) either virtually or in office. No detailed message left   Last AWVI no information  please schedule at anytime with [facility] Nurse Health Advisor 1 or 2   This should be a 45 minute visit. 

## 2020-04-02 ENCOUNTER — Other Ambulatory Visit: Payer: Self-pay | Admitting: Adult Health

## 2020-04-24 DIAGNOSIS — Z20822 Contact with and (suspected) exposure to covid-19: Secondary | ICD-10-CM | POA: Diagnosis not present

## 2020-04-26 DIAGNOSIS — N3000 Acute cystitis without hematuria: Secondary | ICD-10-CM | POA: Diagnosis not present

## 2020-04-26 DIAGNOSIS — R350 Frequency of micturition: Secondary | ICD-10-CM | POA: Diagnosis not present

## 2020-04-28 ENCOUNTER — Telehealth: Payer: Self-pay | Admitting: Adult Health

## 2020-04-29 ENCOUNTER — Encounter: Payer: Self-pay | Admitting: Adult Health

## 2020-04-29 ENCOUNTER — Telehealth (INDEPENDENT_AMBULATORY_CARE_PROVIDER_SITE_OTHER): Payer: PPO | Admitting: Adult Health

## 2020-04-29 DIAGNOSIS — R519 Headache, unspecified: Secondary | ICD-10-CM | POA: Diagnosis not present

## 2020-04-29 DIAGNOSIS — K529 Noninfective gastroenteritis and colitis, unspecified: Secondary | ICD-10-CM

## 2020-04-29 NOTE — Progress Notes (Signed)
Virtual Visit via Video Note  I connected with Pamela Kim on 04/29/20 at  7:00 AM EDT by a video enabled telemedicine application and verified that I am speaking with the correct person using two identifiers.  Location patient: home Location provider:work or home office Persons participating in the virtual visit: patient, provider  I discussed the limitations of evaluation and management by telemedicine and the availability of in person appointments. The patient expressed understanding and agreed to proceed.   HPI: 75 year old female who is being evaluated today for an acute issue.  She was seen over the weekend at urgent care.  At that time 4 days ago she started having vomiting and diarrhea, the next day she developed a fever up to 100.5 F and possibly some burning with urination, frequency, and urinary pain.  She had similar symptoms with upset GI about 2 weeks prior which resolved.  She had a negative PCR and flu.  Fever eventually resolved prior to going to urgent care on Saturday.  No longer having diarrhea or vomiting.  Does have a little nausea and headache.  She was prescribed Macrobid for suspected UTI her UA showed leukocytes and blood.  She does have a history of microscopic hematuria.  Urine culture came back negative.  Thankfully she never did the antibiotics   ROS: See pertinent positives and negatives per HPI.  Past Medical History:  Diagnosis Date  . Hypertension   . Hypothyroidism     Past Surgical History:  Procedure Laterality Date  . CATARACT EXTRACTION, BILATERAL  2016  . HAND SURGERY      Family History  Problem Relation Age of Onset  . Asthma Mother   . Mental illness Mother   . Graves' disease Mother   . COPD Mother   . Heart murmur Father   . Learning disabilities Sister   . Hypothyroidism Sister   . Hypothyroidism Sister        Current Outpatient Medications:  .  losartan-hydrochlorothiazide (HYZAAR) 50-12.5 MG tablet, TAKE ONE TABLET BY  MOUTH ONE TIME DAILY, Disp: 90 tablet, Rfl: 2 .  Omega 3 1000 MG CAPS, Take 2,000 mg by mouth daily., Disp: , Rfl:  .  thyroid (ARMOUR THYROID) 60 MG tablet, Take 1 tablet (60 mg total) by mouth daily before breakfast., Disp: 90 tablet, Rfl: 2 .  VITAMIN A PO, Take 10,000 Units by mouth daily., Disp: , Rfl:  .  VITAMIN D PO, Take 4,000 Units by mouth daily., Disp: , Rfl:  .  VITAMIN K PO, Take 120 mcg by mouth daily., Disp: , Rfl:   EXAM:  VITALS per patient if applicable:  GENERAL: alert, oriented, appears well and in no acute distress  HEENT: atraumatic, conjunttiva clear, no obvious abnormalities on inspection of external nose and ears  NECK: normal movements of the head and neck  LUNGS: on inspection no signs of respiratory distress, breathing rate appears normal, no obvious gross SOB, gasping or wheezing  CV: no obvious cyanosis  MS: moves all visible extremities without noticeable abnormality  PSYCH/NEURO: pleasant and cooperative, no obvious depression or anxiety, speech and thought processing grossly intact  ASSESSMENT AND PLAN:  Discussed the following assessment and plan:  1. Gastroenteritis -Seems to be resolving.  Advise starting Prilosec for possible GERD related symptoms.  She can take this for 2 to 3 weeks and then stop. - Consider referral to GI if symptoms continue to be an issue  2. Acute nonintractable headache, unspecified headache type - Likely from dehydration.  -  Increase fluids and add Pedilyte   Shirline Frees, NP         I discussed the assessment and treatment plan with the patient. The patient was provided an opportunity to ask questions and all were answered. The patient agreed with the plan and demonstrated an understanding of the instructions.   The patient was advised to call back or seek an in-person evaluation if the symptoms worsen or if the condition fails to improve as anticipated.   Shirline Frees, NP

## 2020-05-20 NOTE — Telephone Encounter (Signed)
error 

## 2020-06-19 ENCOUNTER — Telehealth: Payer: Self-pay | Admitting: Adult Health

## 2020-06-19 NOTE — Telephone Encounter (Signed)
Left message for patient to call back and schedule Medicare Annual Wellness Visit (AWV) either virtually or in office.   AWV-S per [place] 12/03/14  please schedule at anytime with [facility] Nurse Health Advisor 1 or 2   This should be a 45 minute visit.  

## 2020-07-11 DIAGNOSIS — M79644 Pain in right finger(s): Secondary | ICD-10-CM | POA: Diagnosis not present

## 2020-07-11 DIAGNOSIS — M180 Bilateral primary osteoarthritis of first carpometacarpal joints: Secondary | ICD-10-CM | POA: Diagnosis not present

## 2020-07-11 DIAGNOSIS — M1811 Unilateral primary osteoarthritis of first carpometacarpal joint, right hand: Secondary | ICD-10-CM | POA: Diagnosis not present

## 2020-07-16 DIAGNOSIS — M180 Bilateral primary osteoarthritis of first carpometacarpal joints: Secondary | ICD-10-CM | POA: Diagnosis not present

## 2020-07-30 DIAGNOSIS — M1812 Unilateral primary osteoarthritis of first carpometacarpal joint, left hand: Secondary | ICD-10-CM | POA: Diagnosis not present

## 2020-07-30 DIAGNOSIS — M180 Bilateral primary osteoarthritis of first carpometacarpal joints: Secondary | ICD-10-CM | POA: Diagnosis not present

## 2020-08-07 ENCOUNTER — Telehealth: Payer: Self-pay | Admitting: Adult Health

## 2020-08-07 DIAGNOSIS — M180 Bilateral primary osteoarthritis of first carpometacarpal joints: Secondary | ICD-10-CM | POA: Diagnosis not present

## 2020-08-07 NOTE — Telephone Encounter (Signed)
Left message for patient to call back and schedule Medicare Annual Wellness Visit (AWV) either virtually or in office.   AWV-S per [place] 12/03/14  please schedule at anytime with [facility] Nurse Health Advisor 1 or 2   This should be a 45 minute visit.

## 2020-10-14 ENCOUNTER — Telehealth: Payer: Self-pay | Admitting: Adult Health

## 2020-10-14 NOTE — Telephone Encounter (Signed)
Spoke to patient to schedule Medicare Annual Wellness Visit (AWV) either virtually or in office. Left  my Pamela Kim number [phone]    AWV-S per [place] 12/03/14 please schedule at anytime with [facility] Nurse Health Advisor 1 or 2   This should be a 45 minute visit.   Patient stated she has a lot going on call back after first of year.  If things settle down prior to first of year she will call and schedule

## 2020-11-04 DIAGNOSIS — H43813 Vitreous degeneration, bilateral: Secondary | ICD-10-CM | POA: Diagnosis not present

## 2020-11-04 DIAGNOSIS — H18593 Other hereditary corneal dystrophies, bilateral: Secondary | ICD-10-CM | POA: Diagnosis not present

## 2020-11-04 DIAGNOSIS — H524 Presbyopia: Secondary | ICD-10-CM | POA: Diagnosis not present

## 2020-11-04 DIAGNOSIS — H0100A Unspecified blepharitis right eye, upper and lower eyelids: Secondary | ICD-10-CM | POA: Diagnosis not present

## 2020-11-19 DIAGNOSIS — M180 Bilateral primary osteoarthritis of first carpometacarpal joints: Secondary | ICD-10-CM | POA: Diagnosis not present

## 2020-11-20 ENCOUNTER — Other Ambulatory Visit: Payer: Self-pay | Admitting: Adult Health

## 2021-01-20 DIAGNOSIS — J101 Influenza due to other identified influenza virus with other respiratory manifestations: Secondary | ICD-10-CM | POA: Diagnosis not present

## 2021-01-26 ENCOUNTER — Other Ambulatory Visit: Payer: Self-pay | Admitting: Adult Health

## 2021-01-29 ENCOUNTER — Other Ambulatory Visit: Payer: Self-pay | Admitting: Adult Health

## 2021-02-06 ENCOUNTER — Ambulatory Visit: Payer: PPO | Admitting: Adult Health

## 2021-02-09 ENCOUNTER — Telehealth: Payer: Self-pay | Admitting: Adult Health

## 2021-02-09 NOTE — Telephone Encounter (Signed)
Left message for patient to call back and schedule Medicare Annual Wellness Visit (AWV) either virtually or in office. Left  my Zachery Conch number [phone]   AWV-S per [place] 12/03/14 please schedule at anytime with [facility] Nurse Health Advisor 1 or 2   This should be a 45 minute visit.

## 2021-02-20 ENCOUNTER — Ambulatory Visit: Payer: PPO | Admitting: Adult Health

## 2021-02-24 ENCOUNTER — Ambulatory Visit (INDEPENDENT_AMBULATORY_CARE_PROVIDER_SITE_OTHER): Payer: PPO | Admitting: Adult Health

## 2021-02-24 ENCOUNTER — Encounter: Payer: Self-pay | Admitting: Adult Health

## 2021-02-24 VITALS — BP 140/100 | HR 83 | Temp 98.2°F | Ht 64.0 in | Wt 137.2 lb

## 2021-02-24 DIAGNOSIS — Z23 Encounter for immunization: Secondary | ICD-10-CM

## 2021-02-24 DIAGNOSIS — Z1159 Encounter for screening for other viral diseases: Secondary | ICD-10-CM

## 2021-02-24 DIAGNOSIS — Z0000 Encounter for general adult medical examination without abnormal findings: Secondary | ICD-10-CM

## 2021-02-24 DIAGNOSIS — I10 Essential (primary) hypertension: Secondary | ICD-10-CM

## 2021-02-24 DIAGNOSIS — Z0001 Encounter for general adult medical examination with abnormal findings: Secondary | ICD-10-CM

## 2021-02-24 DIAGNOSIS — Z1231 Encounter for screening mammogram for malignant neoplasm of breast: Secondary | ICD-10-CM

## 2021-02-24 DIAGNOSIS — E039 Hypothyroidism, unspecified: Secondary | ICD-10-CM | POA: Diagnosis not present

## 2021-02-24 DIAGNOSIS — E2839 Other primary ovarian failure: Secondary | ICD-10-CM

## 2021-02-24 DIAGNOSIS — H6121 Impacted cerumen, right ear: Secondary | ICD-10-CM | POA: Diagnosis not present

## 2021-02-24 LAB — COMPREHENSIVE METABOLIC PANEL
ALT: 12 U/L (ref 0–35)
AST: 20 U/L (ref 0–37)
Albumin: 4.5 g/dL (ref 3.5–5.2)
Alkaline Phosphatase: 70 U/L (ref 39–117)
BUN: 12 mg/dL (ref 6–23)
CO2: 31 mEq/L (ref 19–32)
Calcium: 10.1 mg/dL (ref 8.4–10.5)
Chloride: 96 mEq/L (ref 96–112)
Creatinine, Ser: 0.92 mg/dL (ref 0.40–1.20)
GFR: 61.69 mL/min (ref >60.00)
Glucose, Bld: 95 mg/dL (ref 70–99)
Potassium: 3.9 mEq/L (ref 3.5–5.1)
Sodium: 133 mEq/L — ABNORMAL LOW (ref 135–145)
Total Bilirubin: 0.5 mg/dL (ref 0.2–1.2)
Total Protein: 7.7 g/dL (ref 6.0–8.3)

## 2021-02-24 LAB — CBC WITH DIFFERENTIAL/PLATELET
Basophils Absolute: 0 10*3/uL (ref 0.0–0.1)
Basophils Relative: 0.4 % (ref 0.0–3.0)
Eosinophils Absolute: 0.1 10*3/uL (ref 0.0–0.7)
Eosinophils Relative: 1.3 % (ref 0.0–5.0)
HCT: 35.5 % — ABNORMAL LOW (ref 36.0–46.0)
Hemoglobin: 11.7 g/dL — ABNORMAL LOW (ref 12.0–15.0)
Lymphocytes Relative: 13.9 % (ref 12.0–46.0)
Lymphs Abs: 1.2 10*3/uL (ref 0.7–4.0)
MCHC: 32.9 g/dL (ref 30.0–36.0)
MCV: 92 fl (ref 78.0–100.0)
Monocytes Absolute: 0.6 10*3/uL (ref 0.1–1.0)
Monocytes Relative: 6.9 % (ref 3.0–12.0)
Neutro Abs: 7 10*3/uL (ref 1.4–7.7)
Neutrophils Relative %: 77.5 % — ABNORMAL HIGH (ref 43.0–77.0)
Platelets: 228 10*3/uL (ref 150.0–400.0)
RBC: 3.86 Mil/uL — ABNORMAL LOW (ref 3.87–5.11)
RDW: 13.8 % (ref 11.5–15.5)
WBC: 9 10*3/uL (ref 4.0–10.5)

## 2021-02-24 LAB — LIPID PANEL
Cholesterol: 229 mg/dL — ABNORMAL HIGH (ref 0–200)
HDL: 67.9 mg/dL (ref >39.00)
LDL Cholesterol: 149 mg/dL — ABNORMAL HIGH (ref 0–99)
NonHDL: 161.38
Total CHOL/HDL Ratio: 3
Triglycerides: 64 mg/dL (ref 0.0–149.0)
VLDL: 12.8 mg/dL (ref 0.0–40.0)

## 2021-02-24 LAB — TSH: TSH: 2.35 u[IU]/mL (ref 0.35–5.50)

## 2021-02-24 NOTE — Progress Notes (Signed)
Subjective:    Patient ID: Pamela Kim, female    DOB: 18-Nov-1945, 76 y.o.   MRN: [id number]  HPI Patient presents for yearly preventative medicine examination. She is a pleasant 76 year old female who  has a past medical history of Hypertension and Hypothyroidism.  Essential hypertension-prescribed Hyzaar 50-12.5 mg daily. She does not check her blood pressure at home. Denies symptoms of high blood pressure.  BP Readings from Last 3 Encounters:  02/24/21 (!) 140/100  08/15/19 (!) 146/78  11/16/11 (!) 159/79   Hypothyroidism-currently prescribed Armour Thyroid 60 mg every morning.  She has tried Synthroid in the past on multiple occasions and reports this Synthroid caused her to have stiffness in her muscles and fatigue. Lab Results  Component Value Date   TSH 1.38 03/04/2020   All immunizations and health maintenance protocols were reviewed with the patient and needed orders were placed.  Appropriate screening laboratory values were ordered for the patient including screening of hyperlipidemia, renal function and hepatic function.  Medication reconciliation,  past medical history, social history, problem list and allergies were reviewed in detail with the patient  Goals were established with regard to weight loss, exercise, and  diet in compliance with medications. She is walking 3-4 miles multiple times a week    Review of Systems  Constitutional: Negative.   HENT: Negative.    Eyes: Negative.   Respiratory: Negative.    Cardiovascular: Negative.   Gastrointestinal: Negative.   Endocrine: Negative.   Genitourinary: Negative.   Musculoskeletal: Negative.   Skin: Negative.   Allergic/Immunologic: Negative.   Neurological: Negative.   Hematological: Negative.   Psychiatric/Behavioral: Negative.     Past Medical History:  Diagnosis Date   Hypertension    Hypothyroidism     Social History   Socioeconomic History   Marital status: Married    Spouse name: Not on  file   Number of children: Not on file   Years of education: Not on file   Highest education level: Not on file  Occupational History   Not on file  Tobacco Use   Smoking status: Never   Smokeless tobacco: Never  Vaping Use   Vaping Use: Never used  Substance and Sexual Activity   Alcohol use: Yes    Comment: Twice yearly   Drug use: Not on file   Sexual activity: Not on file  Other Topics Concern   Not on file  Social History Narrative   Not on file   Social Determinants of Health   Financial Resource Strain: Not on file  Food Insecurity: Not on file  Transportation Needs: Not on file  Physical Activity: Not on file  Stress: Not on file  Social Connections: Not on file  Intimate Partner Violence: Not on file    Past Surgical History:  Procedure Laterality Date   CATARACT EXTRACTION, BILATERAL  2016   HAND SURGERY      Family History  Problem Relation Age of Onset   Asthma Mother    Mental illness Mother    Berenice Primas' disease Mother    COPD Mother    Heart murmur Father    Learning disabilities Sister    Hypothyroidism Sister    Hypothyroidism Sister     Allergies  Allergen Reactions   Aspirin Hives   Keflex [Cephalexin] Hives   Penicillins Hives    Current Outpatient Medications on File Prior to Visit  Medication Sig Dispense Refill   losartan-hydrochlorothiazide (HYZAAR) 50-12.5 MG tablet TAKE ONE TABLET  BY MOUTH ONE TIME DAILY 90 tablet 0   thyroid (ARMOUR THYROID) 60 MG tablet Take 1 tablet (60 mg total) by mouth daily before breakfast. NEED PHYSICAL EXAM FOR FURTHER REFILLS 90 tablet 0   VITAMIN K PO Take 120 mcg by mouth daily.     No current facility-administered medications on file prior to visit.    BP (!) 140/100 (BP Location: Left Arm, Patient Position: Sitting, Cuff Size: Normal)    Pulse 83    Temp 98.2 F (36.8 C) (Oral)    Ht 5\' 4"  (1.626 m)    Wt 137 lb 3.2 oz (62.2 kg)    SpO2 96%    BMI 23.55 kg/m        Objective:   Physical  Exam Vitals and nursing note reviewed.  Constitutional:      General: She is not in acute distress.    Appearance: Normal appearance. She is well-developed. She is not ill-appearing.  HENT:     Head: Normocephalic and atraumatic.     Right Ear: Tympanic membrane, ear canal and external ear normal. There is impacted cerumen.     Left Ear: Tympanic membrane, ear canal and external ear normal. There is no impacted cerumen.     Nose: Nose normal. No congestion or rhinorrhea.     Mouth/Throat:     Mouth: Mucous membranes are moist.     Pharynx: Oropharynx is clear. No oropharyngeal exudate or posterior oropharyngeal erythema.  Eyes:     General:        Right eye: No discharge.        Left eye: No discharge.     Extraocular Movements: Extraocular movements intact.     Conjunctiva/sclera: Conjunctivae normal.     Pupils: Pupils are equal, round, and reactive to light.  Neck:     Thyroid: No thyromegaly.     Vascular: No carotid bruit.     Trachea: No tracheal deviation.  Cardiovascular:     Rate and Rhythm: Normal rate and regular rhythm.     Pulses: Normal pulses.     Heart sounds: Normal heart sounds. No murmur heard.   No friction rub. No gallop.  Pulmonary:     Effort: Pulmonary effort is normal. No respiratory distress.     Breath sounds: Normal breath sounds. No stridor. No wheezing, rhonchi or rales.  Chest:     Chest wall: No tenderness.  Abdominal:     General: Abdomen is flat. Bowel sounds are normal. There is no distension.     Palpations: Abdomen is soft. There is no mass.     Tenderness: There is no abdominal tenderness. There is no right CVA tenderness, left CVA tenderness, guarding or rebound.     Hernia: No hernia is present.  Musculoskeletal:        General: No swelling, tenderness, deformity or signs of injury. Normal range of motion.     Cervical back: Normal range of motion and neck supple.     Right lower leg: No edema.     Left lower leg: No edema.   Lymphadenopathy:     Cervical: No cervical adenopathy.  Skin:    General: Skin is warm and dry.     Coloration: Skin is not jaundiced or pale.     Findings: No bruising, erythema or rash.  Neurological:     General: No focal deficit present.     Mental Status: She is alert and oriented to person, place, and time.  Cranial Nerves: No cranial nerve deficit.     Sensory: No sensory deficit.     Motor: No weakness.     Coordination: Coordination normal.     Gait: Gait normal.     Deep Tendon Reflexes: Reflexes normal.  Psychiatric:        Mood and Affect: Mood normal.        Behavior: Behavior normal.        Thought Content: Thought content normal.        Judgment: Judgment normal.      Assessment & Plan:  1. Routine general medical examination at a health care facility  - CBC with Differential/Platelet; Future - Comprehensive metabolic panel; Future - Lipid panel; Future - TSH; Future  2. Essential hypertension - Will have her monitor her BP at home - Send me results via mychart in 2 weeks  - CBC with Differential/Platelet; Future - Comprehensive metabolic panel; Future - Lipid panel; Future - TSH; Future  3. Acquired hypothyroidism - Consider increase in thyroid medication  - CBC with Differential/Platelet; Future - Comprehensive metabolic panel; Future - Lipid panel; Future - TSH; Future  4. Need for hepatitis C screening test  - Hep C Antibody; Future  5. Screening mammogram for breast cancer  - MM DIGITAL SCREENING BILATERAL; Future  6. Estrogen deficiency  - DG Bone Density; Future  7. Impacted cerumen of right ear - verbal consent obtained.  Warm water was applied and gentle ear lavage performed to right ear . There were no complications and following the disimpaction the tympanic membrane was. Tympanic membranes are intact following the procedure.  Auditory canals are normal.  The patient reported relief of symptoms after removal of cerumen. Patient  tolerated procedure well.    8. Need for vaccination with 13-polyvalent pneumococcal conjugate vaccine  - Pneumococcal polysaccharide vaccine 23-valent greater than or equal to 2yo subcutaneous/IM   Dorothyann Peng, NP

## 2021-02-24 NOTE — Patient Instructions (Signed)
It was great seeing you today   We will follow up with you regarding your lab work   Please monitor your blood pressure for me over the next two weeks. You can send me the readings via mychart   Please let me know if you need anything

## 2021-02-25 ENCOUNTER — Encounter: Payer: Self-pay | Admitting: Adult Health

## 2021-02-25 ENCOUNTER — Other Ambulatory Visit: Payer: Self-pay | Admitting: Adult Health

## 2021-02-25 DIAGNOSIS — E785 Hyperlipidemia, unspecified: Secondary | ICD-10-CM

## 2021-02-25 DIAGNOSIS — E039 Hypothyroidism, unspecified: Secondary | ICD-10-CM

## 2021-02-25 LAB — HEPATITIS C ANTIBODY
Hepatitis C Ab: NONREACTIVE
SIGNAL TO CUT-OFF: 0.11 (ref <1.00)

## 2021-02-25 MED ORDER — THYROID 60 MG PO TABS: 60.0000 mg | ORAL_TABLET | Freq: Every day | ORAL | 3 refills | Status: DC

## 2021-02-26 ENCOUNTER — Telehealth: Payer: Self-pay | Admitting: Adult Health

## 2021-02-26 NOTE — Telephone Encounter (Signed)
Patient is calling in requesting a refill for losartan-hydrochlorothiazide (HYZAAR) 50-12.5 MG tablet [id number]  to be sent to her pharmacy. Patient stated that 1/2 of her medications was sent in and this was the the one that wasn't sent in after her visit on 01/24.  Patient stated that the quantity should be 90.  Pharmacy is [facility] [id number]  [place], [place] [address]   Patient could be contacted at [phone].  Please advise.

## 2021-02-27 MED ORDER — LOSARTAN POTASSIUM-HCTZ 50-12.5 MG PO TABS: 1.0000 | ORAL_TABLET | Freq: Every day | ORAL | 3 refills | Status: DC

## 2021-03-11 DIAGNOSIS — M180 Bilateral primary osteoarthritis of first carpometacarpal joints: Secondary | ICD-10-CM | POA: Diagnosis not present

## 2021-04-29 ENCOUNTER — Encounter: Payer: Self-pay | Admitting: Adult Health

## 2021-04-29 ENCOUNTER — Other Ambulatory Visit: Payer: Self-pay | Admitting: Adult Health

## 2021-04-29 NOTE — Telephone Encounter (Signed)
Please advise 

## 2021-06-03 ENCOUNTER — Encounter: Payer: Self-pay | Admitting: Adult Health

## 2021-06-03 NOTE — Telephone Encounter (Signed)
FYI

## 2021-08-05 ENCOUNTER — Ambulatory Visit
Admission: RE | Admit: 2021-08-05 | Discharge: 2021-08-05 | Disposition: A | Discharge status: Home / Self Care | Payer: PPO | Source: Ambulatory Visit | Attending: Adult Health | Admitting: Adult Health

## 2021-08-05 DIAGNOSIS — M810 Age-related osteoporosis without current pathological fracture: Secondary | ICD-10-CM | POA: Diagnosis not present

## 2021-08-05 DIAGNOSIS — E2839 Other primary ovarian failure: Secondary | ICD-10-CM

## 2021-08-05 DIAGNOSIS — Z780 Asymptomatic menopausal state: Secondary | ICD-10-CM | POA: Diagnosis not present

## 2021-08-05 DIAGNOSIS — Z1231 Encounter for screening mammogram for malignant neoplasm of breast: Secondary | ICD-10-CM | POA: Diagnosis not present

## 2021-08-05 DIAGNOSIS — M85832 Other specified disorders of bone density and structure, left forearm: Secondary | ICD-10-CM | POA: Diagnosis not present

## 2021-08-11 ENCOUNTER — Other Ambulatory Visit (INDEPENDENT_AMBULATORY_CARE_PROVIDER_SITE_OTHER): Payer: PPO

## 2021-08-11 DIAGNOSIS — E785 Hyperlipidemia, unspecified: Secondary | ICD-10-CM

## 2021-08-11 LAB — LIPID PANEL
Cholesterol: 221 mg/dL — ABNORMAL HIGH (ref 0–200)
HDL: 68.5 mg/dL (ref >39.00)
LDL Cholesterol: 133 mg/dL — ABNORMAL HIGH (ref 0–99)
NonHDL: 152.52
Total CHOL/HDL Ratio: 3
Triglycerides: 99 mg/dL (ref 0.0–149.0)
VLDL: 19.8 mg/dL (ref 0.0–40.0)

## 2021-08-18 ENCOUNTER — Ambulatory Visit (INDEPENDENT_AMBULATORY_CARE_PROVIDER_SITE_OTHER): Payer: PPO | Admitting: Adult Health

## 2021-08-18 ENCOUNTER — Encounter: Payer: Self-pay | Admitting: Adult Health

## 2021-08-18 VITALS — BP 120/80 | HR 60 | Temp 97.6°F | Ht 64.0 in | Wt 132.0 lb

## 2021-08-18 DIAGNOSIS — E785 Hyperlipidemia, unspecified: Secondary | ICD-10-CM | POA: Diagnosis not present

## 2021-08-18 DIAGNOSIS — M810 Age-related osteoporosis without current pathological fracture: Secondary | ICD-10-CM

## 2021-08-18 DIAGNOSIS — H9193 Unspecified hearing loss, bilateral: Secondary | ICD-10-CM

## 2021-08-18 LAB — VITAMIN D 25 HYDROXY (VIT D DEFICIENCY, FRACTURES): VITD: 43.87 ng/mL (ref 30.00–100.00)

## 2021-08-18 NOTE — Progress Notes (Signed)
Subjective:    Patient ID: Pamela Kim, female    DOB: May 15, 1945, 76 y.o.   MRN: [id number]  HPI 76 year old female who  has a past medical history of Hypertension and Hypothyroidism.  She presents to the office today for follow up regarding hyperlipidemia and osteoporosis   Hyperlipidemia -new she has been hesitant to start statin medication.  She does eat a vegetarian diet but does not exercise.  She recently had her cholesterol panel redone which showed a mild improvement but her 10-year risk was still roughly 22%.  She is interested in doing cardiac calcium scoring test before starting statin medication.  Lab Results  Component Value Date   CHOL 221 (H) 08/11/2021   HDL 68.50 08/11/2021   LDLCALC 133 (H) 08/11/2021   TRIG 99.0 08/11/2021   CHOLHDL 3 08/11/2021   The 10-year ASCVD risk score (Arnett DK, et al., 2019) is: 21.9%   Values used to calculate the score:     Age: 67 years     Sex: Female     Is Non-Hispanic African American: No     Diabetic: No     Tobacco smoker: No     Systolic Blood Pressure: 138 mmHg     Is BP treated: Yes     HDL Cholesterol: 68.5 mg/dL     Total Cholesterol: 221 mg/dL   Additionally her most recent bone density screen showed osteoporosis with a T score of -2.7 in the right femoral head.  She does not want to start Fosamax.  Is interested in other therapies   Additionally, she would like to be referred to Dr. Maximino Sarin at [facility] for hearing loss.  She went to [facility] to get evaluated but they told her that her ear canals were too small and she would need specialty fitted hearing aids.  Review of Systems See HPI   Past Medical History:  Diagnosis Date   Hypertension    Hypothyroidism     Social History   Socioeconomic History   Marital status: Married    Spouse name: Not on file   Number of children: Not on file   Years of education: Not on file   Highest education level: Not on file  Occupational History   Not on file   Tobacco Use   Smoking status: Never   Smokeless tobacco: Never  Vaping Use   Vaping Use: Never used  Substance and Sexual Activity   Alcohol use: Yes    Comment: Twice yearly   Drug use: Not on file   Sexual activity: Not on file  Other Topics Concern   Not on file  Social History Narrative   Not on file   Social Determinants of Health   Financial Resource Strain: Not on file  Food Insecurity: Not on file  Transportation Needs: Not on file  Physical Activity: Not on file  Stress: Not on file  Social Connections: Not on file  Intimate Partner Violence: Not on file    Past Surgical History:  Procedure Laterality Date   CATARACT EXTRACTION, BILATERAL  2016   HAND SURGERY      Family History  Problem Relation Age of Onset   Asthma Mother    Mental illness Mother    Luiz Blare' disease Mother    COPD Mother    Heart murmur Father    Learning disabilities Sister    Hypothyroidism Sister    Hypothyroidism Sister     Allergies  Allergen Reactions  Aspirin Hives   Keflex [Cephalexin] Hives   Neo-Synephrine [Phenylephrine Hcl (Pressors)] Other (See Comments)    Migraine headaches    Penicillins Hives   Wal-Profen D Cold & Sinus [Pseudoephedrine-Ibuprofen] Other (See Comments)    Current Outpatient Medications on File Prior to Visit  Medication Sig Dispense Refill   b complex vitamins capsule Take 1 capsule by mouth daily.     Beta Carotene (VITAMIN A) 25000 UNIT capsule Take 25,000 Units by mouth daily.     Cholecalciferol (VITAMIN D3) 1.25 MG (50000 UT) CAPS Take by mouth.     co-enzyme Q-10 30 MG capsule Take 30 mg by mouth 3 (three) times daily.     losartan-hydrochlorothiazide (HYZAAR) 50-12.5 MG tablet Take 1 tablet by mouth daily. 90 tablet 3   MAGNESIUM GLYCINATE PO Take by mouth.     Omega-3 1000 MG CAPS Take by mouth. Advance Omega     RED YEAST RICE EXTRACT PO Take by mouth.     thyroid (ARMOUR THYROID) 60 MG tablet Take 1 tablet (60 mg total) by mouth  daily before breakfast. 90 tablet 3   Turmeric (QC TUMERIC COMPLEX PO) Take by mouth.     VITAMIN K PO Take 120 mcg by mouth daily.     No current facility-administered medications on file prior to visit.    BP 120/80   Pulse 60   Temp 97.6 F (36.4 C) (Oral)   Ht 5\' 4"  (1.626 m)   Wt 132 lb (59.9 kg)   SpO2 97%   BMI 22.66 kg/m       Objective:   Physical Exam Vitals and nursing note reviewed.  Constitutional:      Appearance: Normal appearance.  Skin:    General: Skin is warm and dry.  Neurological:     General: No focal deficit present.     Mental Status: She is alert and oriented to person, place, and time.  Psychiatric:        Mood and Affect: Mood normal.        Behavior: Behavior normal.        Thought Content: Thought content normal.        Judgment: Judgment normal.       Assessment & Plan:  1. Hyperlipidemia, unspecified hyperlipidemia type - Encouraged statin - Will order CT cardiac scoring for further evaluation  - CT CARDIAC SCORING (SELF PAY ONLY); Future  2. Osteoporosis without current pathological fracture, unspecified osteoporosis type - Advised to do research on Prolia. She will let me know if she wants to try this medication.  - Work on weight bearing exercise - VITAMIN D 25 Hydroxy (Vit-D Deficiency, Fractures); Future - VITAMIN D 25 Hydroxy (Vit-D Deficiency, Fractures)  3. Bilateral hearing loss, unspecified hearing loss type  - Ambulatory referral to ENT  , NP

## 2021-08-18 NOTE — Patient Instructions (Addendum)
I am going to refer you to Dr. Dorma Russell  I have also ordered a CT cardiac screen - they will call you to schedule   Look at [facility] for osteoporosis

## 2021-08-19 ENCOUNTER — Telehealth: Payer: Self-pay | Admitting: Adult Health

## 2021-08-19 NOTE — Telephone Encounter (Signed)
Pharmacy updated.

## 2021-08-19 NOTE — Telephone Encounter (Signed)
Patient states this is her new pharmacy--  [facility] [address] [place] [address] [phone]:   She asks that we please remove all other [facility] and [facility] pharmacies from her chart.

## 2021-09-02 ENCOUNTER — Other Ambulatory Visit: Payer: Self-pay | Admitting: Adult Health

## 2021-09-02 MED ORDER — SIMVASTATIN 10 MG PO TABS: 10.0000 mg | ORAL_TABLET | Freq: Every day | ORAL | 3 refills | Status: DC

## 2021-09-23 ENCOUNTER — Encounter: Payer: Self-pay | Admitting: Adult Health

## 2021-09-23 ENCOUNTER — Ambulatory Visit (INDEPENDENT_AMBULATORY_CARE_PROVIDER_SITE_OTHER): Payer: PPO | Admitting: Adult Health

## 2021-09-23 VITALS — BP 138/70 | HR 72 | Temp 97.8°F | Ht 64.0 in | Wt 129.0 lb

## 2021-09-23 DIAGNOSIS — E785 Hyperlipidemia, unspecified: Secondary | ICD-10-CM | POA: Diagnosis not present

## 2021-09-23 NOTE — Patient Instructions (Signed)
Health Maintenance Due  Topic Date Due   Zoster Vaccines- Shingrix (1 of 2) Never done   COVID-19 Vaccine (5 - Pfizer series) 03/04/2021   INFLUENZA VACCINE  09/01/2021      Row Labels 08/18/2021    9:50 AM 12/20/2019    7:04 AM  Depression screen PHQ 2/9   Section Header. No data exists in this row.    Decreased Interest   0 0  Down, Depressed, Hopeless   0 0  PHQ - 2 Score   0 0

## 2021-09-23 NOTE — Progress Notes (Signed)
Subjective:    Patient ID: Pamela Kim, female    DOB: 1945/04/02, 76 y.o.   MRN: [id number]  HPI 76 year old female who  has a past medical history of Hypertension and Hypothyroidism.  She presents to the office today for follow up regarding hyperlipidemia. She has been advised to start a statin and has been prescribed simvastatin but has not taken it yet. She recently had a total calcium score of 28.   She is asympomatic but would like to discuss this with a cardiologist before starting statin   Lab Results  Component Value Date   CHOL 221 (H) 08/11/2021   HDL 68.50 08/11/2021   LDLCALC 133 (H) 08/11/2021   TRIG 99.0 08/11/2021   CHOLHDL 3 08/11/2021   The 10-year ASCVD risk score (Arnett DK, et al., 2019) is: 21.9%   Values used to calculate the score:     Age: 37 years     Sex: Female     Is Non-Hispanic African American: No     Diabetic: No     Tobacco smoker: No     Systolic Blood Pressure: 138 mmHg     Is BP treated: Yes     HDL Cholesterol: 68.5 mg/dL     Total Cholesterol: 221 mg/dL   Review of Systems See HPI   Past Medical History:  Diagnosis Date   Hypertension    Hypothyroidism     Social History   Socioeconomic History   Marital status: Married    Spouse name: Not on file   Number of children: Not on file   Years of education: Not on file   Highest education level: Bachelor's degree (e.g., BA, AB, BS)  Occupational History   Not on file  Tobacco Use   Smoking status: Never   Smokeless tobacco: Never  Vaping Use   Vaping Use: Never used  Substance and Sexual Activity   Alcohol use: Yes    Comment: Twice yearly   Drug use: Not on file   Sexual activity: Not on file  Other Topics Concern   Not on file  Social History Narrative   Not on file   Social Determinants of Health   Financial Resource Strain: Low Risk  (09/21/2021)   Overall Financial Resource Strain (CARDIA)    Difficulty of Paying Living Expenses: Not hard at all   Food Insecurity: No Food Insecurity (09/21/2021)   Hunger Vital Sign    Worried About Running Out of Food in the Last Year: Never true    Ran Out of Food in the Last Year: Never true  Transportation Needs: No Transportation Needs (09/21/2021)   PRAPARE - Administrator, Civil Service (Medical): No    Lack of Transportation (Non-Medical): No  Physical Activity: Sufficiently Active (09/21/2021)   Exercise Vital Sign    Days of Exercise per Week: 4 days    Minutes of Exercise per Session: 60 min  Stress: No Stress Concern Present (09/21/2021)   [employer] of Occupational Health - Occupational Stress Questionnaire    Feeling of Stress : Only a little  Social Connections: Moderately Integrated (09/21/2021)   Social Connection and Isolation Panel [NHANES]    Frequency of Communication with Friends and Family: More than three times a week    Frequency of Social Gatherings with Friends and Family: More than three times a week    Attends Religious Services: More than 4 times per year    Active Member of Clubs or  Organizations: No    Attends Engineer, structural: Not on file    Marital Status: Married  Catering manager Violence: Not on file    Past Surgical History:  Procedure Laterality Date   CATARACT EXTRACTION, BILATERAL  2016   HAND SURGERY      Family History  Problem Relation Age of Onset   Asthma Mother    Mental illness Mother    Luiz Blare' disease Mother    COPD Mother    Heart murmur Father    Learning disabilities Sister    Hypothyroidism Sister    Hypothyroidism Sister     Allergies  Allergen Reactions   Aspirin Hives   Keflex [Cephalexin] Hives   Neo-Synephrine [Phenylephrine Hcl (Pressors)] Other (See Comments)    Migraine headaches    Penicillins Hives   Wal-Profen D Cold & Sinus [Pseudoephedrine-Ibuprofen] Other (See Comments)    Current Outpatient Medications on File Prior to Visit  Medication Sig Dispense Refill   b complex  vitamins capsule Take 1 capsule by mouth daily.     Beta Carotene (VITAMIN A) 25000 UNIT capsule Take 25,000 Units by mouth daily.     Cholecalciferol (VITAMIN D3) 1.25 MG (50000 UT) CAPS Take by mouth.     co-enzyme Q-10 30 MG capsule Take 30 mg by mouth 3 (three) times daily.     losartan-hydrochlorothiazide (HYZAAR) 50-12.5 MG tablet Take 1 tablet by mouth daily. 90 tablet 3   MAGNESIUM GLYCINATE PO Take by mouth.     Omega-3 1000 MG CAPS Take by mouth. Advance Omega     ondansetron (ZOFRAN-ODT) 4 MG disintegrating tablet Take 4 mg by mouth every 8 (eight) hours as needed for nausea or vomiting.     RED YEAST RICE EXTRACT PO Take by mouth.     simvastatin (ZOCOR) 10 MG tablet Take 1 tablet (10 mg total) by mouth at bedtime. 90 tablet 3   thyroid (ARMOUR THYROID) 60 MG tablet Take 1 tablet (60 mg total) by mouth daily before breakfast. 90 tablet 3   Turmeric (QC TUMERIC COMPLEX PO) Take by mouth.     VITAMIN K PO Take 120 mcg by mouth daily.     No current facility-administered medications on file prior to visit.    BP 138/70   Pulse 72   Temp 97.8 F (36.6 C) (Oral)   Ht 5\' 4"  (1.626 m)   Wt 129 lb (58.5 kg)   SpO2 98%   BMI 22.14 kg/m       Objective:   Physical Exam Vitals and nursing note reviewed.  Constitutional:      Appearance: Normal appearance.  Cardiovascular:     Rate and Rhythm: Normal rate and regular rhythm.     Pulses: Normal pulses.     Heart sounds: Normal heart sounds.  Pulmonary:     Effort: Pulmonary effort is normal.     Breath sounds: Normal breath sounds.  Musculoskeletal:        General: Normal range of motion.  Skin:    General: Skin is warm and dry.     Capillary Refill: Capillary refill takes less than 2 seconds.  Neurological:     General: No focal deficit present.     Mental Status: She is alert and oriented to person, place, and time.  Psychiatric:        Mood and Affect: Mood normal.        Behavior: Behavior normal.        Thought  Content:  Thought content normal.        Judgment: Judgment normal.       Assessment & Plan:  1. Hyperlipidemia, unspecified hyperlipidemia type  - Ambulatory referral to Cardiology - per patient request   Shirline Frees, NP

## 2021-10-23 ENCOUNTER — Encounter: Payer: PPO | Admitting: Physician Assistant

## 2021-10-23 ENCOUNTER — Encounter: Payer: Self-pay | Admitting: Adult Health

## 2021-10-23 NOTE — Progress Notes (Signed)
Appt needed for husband   This encounter was created in error - please disregard.

## 2021-10-26 ENCOUNTER — Telehealth: Payer: PPO | Admitting: Physician Assistant

## 2021-10-26 DIAGNOSIS — U071 COVID-19: Secondary | ICD-10-CM | POA: Diagnosis not present

## 2021-10-26 MED ORDER — NIRMATRELVIR/RITONAVIR (PAXLOVID) TABLET (RENAL DOSING): 2.0000 | ORAL_TABLET | Freq: Two times a day (BID) | ORAL | 0 refills | Status: AC

## 2021-10-26 NOTE — Patient Instructions (Signed)
Pamela Kim, thank you for joining Mar Daring, PA-C for today's virtual visit.  While this provider is not your primary care provider (PCP), if your PCP is located in our provider database this encounter information will be shared with them immediately following your visit.  Consent: (Patient) Pamela Kim provided verbal consent for this virtual visit at the beginning of the encounter.  Current Medications:  Current Outpatient Medications:    nirmatrelvir/ritonavir EUA, renal dosing, (PAXLOVID) 10 x 150 MG & 10 x 100MG  TABS, Take 2 tablets by mouth 2 (two) times daily for 5 days. (Take nirmatrelvir 150 mg one tablet twice daily for 5 days and ritonavir 100 mg one tablet twice daily for 5 days) Patient GFR is 61.29, Disp: 20 tablet, Rfl: 0   b complex vitamins capsule, Take 1 capsule by mouth daily., Disp: , Rfl:    Beta Carotene (VITAMIN A) 25000 UNIT capsule, Take 25,000 Units by mouth daily., Disp: , Rfl:    Cholecalciferol (VITAMIN D3) 1.25 MG (50000 UT) CAPS, Take by mouth., Disp: , Rfl:    co-enzyme Q-10 30 MG capsule, Take 30 mg by mouth 3 (three) times daily., Disp: , Rfl:    losartan-hydrochlorothiazide (HYZAAR) 50-12.5 MG tablet, Take 1 tablet by mouth daily., Disp: 90 tablet, Rfl: 3   MAGNESIUM GLYCINATE PO, Take by mouth., Disp: , Rfl:    Omega-3 1000 MG CAPS, Take by mouth. Advance Omega, Disp: , Rfl:    ondansetron (ZOFRAN-ODT) 4 MG disintegrating tablet, Take 4 mg by mouth every 8 (eight) hours as needed for nausea or vomiting., Disp: , Rfl:    simvastatin (ZOCOR) 10 MG tablet, Take 1 tablet (10 mg total) by mouth at bedtime., Disp: 90 tablet, Rfl: 3   thyroid (ARMOUR THYROID) 60 MG tablet, Take 1 tablet (60 mg total) by mouth daily before breakfast., Disp: 90 tablet, Rfl: 3   Turmeric (QC TUMERIC COMPLEX PO), Take by mouth., Disp: , Rfl:    VITAMIN K PO, Take 120 mcg by mouth daily., Disp: , Rfl:    Medications ordered in this encounter:  Meds ordered  this encounter  Medications   nirmatrelvir/ritonavir EUA, renal dosing, (PAXLOVID) 10 x 150 MG & 10 x 100MG  TABS    Sig: Take 2 tablets by mouth 2 (two) times daily for 5 days. (Take nirmatrelvir 150 mg one tablet twice daily for 5 days and ritonavir 100 mg one tablet twice daily for 5 days) Patient GFR is 61.29    Dispense:  20 tablet    Refill:  0    Order Specific Question:   Supervising Provider    Answer:   Chase Picket [id number]     *If you need refills on other medications prior to your next appointment, please contact your pharmacy*  Follow-Up: Call back or seek an in-person evaluation if the symptoms worsen or if the condition fails to improve as anticipated.  [place] [phone]  Other Instructions COVID-19 COVID-19, or coronavirus disease 2019, is an infection that is caused by a new (novel) coronavirus called SARS-CoV-2. COVID-19 can cause many symptoms. In some people, the virus may not cause any symptoms. In others, it may cause mild or severe symptoms. Some people with severe infection develop severe disease. What are the causes? This illness is caused by a virus. The virus may be in the air as tiny specks of fluid (aerosols) or droplets, or it may be on surfaces. You may catch the virus by: Breathing  in droplets from an infected person. Droplets can be spread by a person breathing, speaking, singing, coughing, or sneezing. Touching something, like a table or a doorknob, that has virus on it (is contaminated) and then touching your mouth, nose, or eyes. What increases the risk? Risk for infection: You are more likely to get infected with the COVID-19 virus if: You are within 6 ft (1.8 m) of a person with COVID-19 for 15 minutes or longer. You are providing care for a person who is infected with COVID-19. You are in close personal contact with other people. Close personal contact includes hugging, kissing, or sharing eating or drinking  utensils. Risk for serious illness caused by COVID-19: You are more likely to get seriously ill from the COVID-19 virus if: You have cancer. You have a long-term (chronic) disease, such as: Chronic lung disease. This includes pulmonary embolism, chronic obstructive pulmonary disease, and cystic fibrosis. Long-term disease that lowers your body's ability to fight infection (immunocompromise). Serious cardiac conditions, such as heart failure, coronary artery disease, or cardiomyopathy. Diabetes. Chronic kidney disease. Liver diseases. These include cirrhosis, nonalcoholic fatty liver disease, alcoholic liver disease, or autoimmune hepatitis. You have obesity. You are pregnant or were recently pregnant. You have sickle cell disease. What are the signs or symptoms? Symptoms of this condition can range from mild to severe. Symptoms may appear any time from 2 to 14 days after being exposed to the virus. They include: Fever or chills. Shortness of breath or trouble breathing. Feeling tired or very tired. Headaches, body aches, or muscle aches. Runny or stuffy nose, sneezing, coughing, or sore throat. New loss of taste or smell. This is rare. Some people may also have stomach problems, such as nausea, vomiting, or diarrhea. Other people may not have any symptoms of COVID-19. How is this diagnosed? This condition may be diagnosed by testing samples to check for the COVID-19 virus. The most common tests are the PCR test and the antigen test. Tests may be done in the lab or at home. They include: Using a swab to take a sample of fluid from the back of your nose and throat (nasopharyngeal fluid), from your nose, or from your throat. Testing a sample of saliva from your mouth. Testing a sample of coughed-up mucus from your lungs (sputum). How is this treated? Treatment for COVID-19 infection depends on the severity of the condition. Mild symptoms can be managed at home with rest, fluids, and  over-the-counter medicines. Serious symptoms may be treated in a hospital intensive care unit (ICU). Treatment in the ICU may include: Supplemental oxygen. Extra oxygen is given through a tube in the nose, a face mask, or a hood. Medicines. These may include: Antivirals, such as monoclonal antibodies. These help your body fight off certain viruses that can cause disease. Anti-inflammatories, such as corticosteroids. These reduce inflammation and suppress the immune system. Antithrombotics. These prevent or treat blood clots, if they develop. Convalescent plasma. This helps boost your immune system, if you have an underlying immunosuppressive condition or are getting immunosuppressive treatments. Prone positioning. This means you will lie on your stomach. This helps oxygen to get into your lungs. Infection control measures. If you are at risk for more serious illness caused by COVID-19, your health care provider may prescribe two long-acting monoclonal antibodies, given together every 6 months. How is this prevented? To protect yourself: Use preventive medicine (pre-exposure prophylaxis). You may get pre-exposure prophylaxis if you have moderate or severe immunocompromise. Get vaccinated. Anyone 60 months old  or older who meets guidelines can get a COVID-19 vaccine or vaccine series. This includes people who are pregnant or making breast milk (lactating). Get an added dose of COVID-19 vaccine after your first vaccine or vaccine series if you have moderate to severe immunocompromise. This applies if you have had a solid organ transplant or have been diagnosed with an immunocompromising condition. You should get the added dose 4 weeks after you got the first COVID-19 vaccine or vaccine series. If you get an mRNA vaccine, you will need a 3-dose primary series. If you get the J&J/Janssen vaccine, you will need a 2-dose primary series, with the second dose being an mRNA vaccine. Talk to your health care  provider about getting experimental monoclonal antibodies. This treatment is approved under emergency use authorization to prevent severe illness before or after being exposed to the COVID-19 virus. You may be given monoclonal antibodies if: You have moderate or severe immunocompromise. This includes treatments that lower your immune response. People with immunocompromise may not develop protection against COVID-19 when they are vaccinated. You cannot be vaccinated. You may not get a vaccine if you have a severe allergic reaction to the vaccine or its components. You are not fully vaccinated. You are in a facility where COVID-19 is present and: Are in close contact with a person who is infected with the COVID-19 virus. Are at high risk of being exposed to the COVID-19 virus. You are at risk of illness from new variants of the COVID-19 virus. To protect others: If you have symptoms of COVID-19, take steps to prevent the virus from spreading to others. Stay home. Leave your house only to get medical care. Do not use public transit, if possible. Do not travel while you are sick. Wash your hands often with soap and water for at least 20 seconds. If soap and water are not available, use alcohol-based hand sanitizer. Make sure that all people in your household wash their hands well and often. Cough or sneeze into a tissue or your sleeve or elbow. Do not cough or sneeze into your hand or into the air. Where to find more information Centers for Disease Control and Prevention: CharmC[employer].be World Health Organization: https://www.castaneda.info/ Get help right away if: You have trouble breathing. You have pain or pressure in your chest. You are confused. You have bluish lips and fingernails. You have trouble waking from sleep. You have symptoms that get worse. These symptoms may be an emergency. Get help right away. Call 911. Do not wait to see if the symptoms will go away. Do  not drive yourself to the hospital. Summary COVID-19 is an infection that is caused by a new coronavirus. Sometimes, there are no symptoms. Other times, symptoms range from mild to severe. Some people with a severe COVID-19 infection develop severe disease. The virus that causes COVID-19 can spread from person to person through droplets or aerosols from breathing, speaking, singing, coughing, or sneezing. Mild symptoms of COVID-19 can be managed at home with rest, fluids, and over-the-counter medicines. This information is not intended to replace advice given to you by your health care provider. Make sure you discuss any questions you have with your health care provider. Document Revised: 01/08/2021 Document Reviewed: 01/08/2021 Elsevier Patient Education  [place].    If you have been instructed to have an in-person evaluation today at a local Urgent Care facility, please use the link below. It will take you to a list of all of our available [facility]  Urgent Cares, including address, phone number and hours of operation. Please do not delay care.  [facility]  If you or a family member do not have a primary care provider, use the link below to schedule a visit and establish care. When you choose a [facility] primary care physician or advanced practice provider, you gain a long-term partner in health. Find a Primary Care Provider  Learn more about [facility]'s in-office and virtual care options: [facility]

## 2021-10-26 NOTE — Progress Notes (Signed)
Virtual Visit Consent   Pamela Kim, you are scheduled for a virtual visit with a [facility] provider today. Just as with appointments in the office, your consent must be obtained to participate. Your consent will be active for this visit and any virtual visit you may have with one of our providers in the next 365 days. If you have a MyChart account, a copy of this consent can be sent to you electronically.  As this is a virtual visit, video technology does not allow for your provider to perform a traditional examination. This may limit your provider's ability to fully assess your condition. If your provider identifies any concerns that need to be evaluated in person or the need to arrange testing (such as labs, EKG, etc.), we will make arrangements to do so. Although advances in technology are sophisticated, we cannot ensure that it will always work on either your end or our end. If the connection with a video visit is poor, the visit may have to be switched to a telephone visit. With either a video or telephone visit, we are not always able to ensure that we have a secure connection.  By engaging in this virtual visit, you consent to the provision of healthcare and authorize for your insurance to be billed (if applicable) for the services provided during this visit. Depending on your insurance coverage, you may receive a charge related to this service.  I need to obtain your verbal consent now. Are you willing to proceed with your visit today? KEALEY KEMMER has provided verbal consent on 10/26/2021 for a virtual visit (video or telephone). Mar Daring, PA-C  Date: 10/26/2021 7:57 AM  Virtual Visit via Video Note   I, Mar Daring, connected with  Pamela Kim  ([id number], 06-12-1945) on 10/26/21 at  7:45 AM EDT by a video-enabled telemedicine application and verified that I am speaking with the correct person using two identifiers.  Location: Patient: Virtual Visit  Location Patient: Home Provider: Virtual Visit Location Provider: Home Office   I discussed the limitations of evaluation and management by telemedicine and the availability of in person appointments. The patient expressed understanding and agreed to proceed.    History of Present Illness: Pamela Kim is a 76 y.o. who identifies as a female who was assigned female at birth, and is being seen today for Covid 64.  HPI: URI  This is a new problem. Episode onset: Symptoms started yesterday; then tested positive this mornign. The problem has been gradually worsening. The maximum temperature recorded prior to her arrival was 100.4 - 100.9 F (100.9). Associated symptoms include congestion, coughing, headaches, nausea and sinus pain. Associated symptoms comments: Chills, myalgias. She has tried nothing for the symptoms. The treatment provided no relief.      Problems:  Patient Active Problem List   Diagnosis Date Noted   Fracture of 5th metatarsal 10/20/2011    Allergies:  Allergies  Allergen Reactions   Aspirin Hives   Keflex [Cephalexin] Hives   Neo-Synephrine [Phenylephrine Hcl (Pressors)] Other (See Comments)    Migraine headaches    Penicillins Hives   Wal-Profen D Cold & Sinus [Pseudoephedrine-Ibuprofen] Other (See Comments)   Medications:  Current Outpatient Medications:    nirmatrelvir/ritonavir EUA, renal dosing, (PAXLOVID) 10 x 150 MG & 10 x 100MG  TABS, Take 2 tablets by mouth 2 (two) times daily for 5 days. (Take nirmatrelvir 150 mg one tablet twice daily for 5 days and ritonavir 100 mg one tablet  twice daily for 5 days) Patient GFR is 61.29, Disp: 20 tablet, Rfl: 0   b complex vitamins capsule, Take 1 capsule by mouth daily., Disp: , Rfl:    Beta Carotene (VITAMIN A) 25000 UNIT capsule, Take 25,000 Units by mouth daily., Disp: , Rfl:    Cholecalciferol (VITAMIN D3) 1.25 MG (50000 UT) CAPS, Take by mouth., Disp: , Rfl:    co-enzyme Q-10 30 MG capsule, Take 30 mg by mouth  3 (three) times daily., Disp: , Rfl:    losartan-hydrochlorothiazide (HYZAAR) 50-12.5 MG tablet, Take 1 tablet by mouth daily., Disp: 90 tablet, Rfl: 3   MAGNESIUM GLYCINATE PO, Take by mouth., Disp: , Rfl:    Omega-3 1000 MG CAPS, Take by mouth. Advance Omega, Disp: , Rfl:    ondansetron (ZOFRAN-ODT) 4 MG disintegrating tablet, Take 4 mg by mouth every 8 (eight) hours as needed for nausea or vomiting., Disp: , Rfl:    simvastatin (ZOCOR) 10 MG tablet, Take 1 tablet (10 mg total) by mouth at bedtime., Disp: 90 tablet, Rfl: 3   thyroid (ARMOUR THYROID) 60 MG tablet, Take 1 tablet (60 mg total) by mouth daily before breakfast., Disp: 90 tablet, Rfl: 3   Turmeric (QC TUMERIC COMPLEX PO), Take by mouth., Disp: , Rfl:    VITAMIN K PO, Take 120 mcg by mouth daily., Disp: , Rfl:   Observations/Objective: Patient is well-developed, well-nourished in no acute distress.  Resting comfortably at home.  Head is normocephalic, atraumatic.  No labored breathing.  Speech is clear and coherent with logical content.  Patient is alert and oriented at baseline.    Assessment and Plan: 1. COVID-19 - nirmatrelvir/ritonavir EUA, renal dosing, (PAXLOVID) 10 x 150 MG & 10 x 100MG  TABS; Take 2 tablets by mouth 2 (two) times daily for 5 days. (Take nirmatrelvir 150 mg one tablet twice daily for 5 days and ritonavir 100 mg one tablet twice daily for 5 days) Patient GFR is 61.29  Dispense: 20 tablet; Refill: 0  - Continue OTC symptomatic management of choice - Will send OTC vitamins and supplement information through AVS - Paxlovid (renal) prescribed - Patient enrolled in MyChart symptom monitoring - Push fluids - Rest as needed - Discussed return precautions and when to seek in-person evaluation, sent via AVS as well   Follow Up Instructions: I discussed the assessment and treatment plan with the patient. The patient was provided an opportunity to ask questions and all were answered. The patient agreed with the  plan and demonstrated an understanding of the instructions.  A copy of instructions were sent to the patient via MyChart unless otherwise noted below.    The patient was advised to call back or seek an in-person evaluation if the symptoms worsen or if the condition fails to improve as anticipated.  Time:  I spent 13 minutes with the patient via telehealth technology discussing the above problems/concerns.    , PA-C

## 2021-10-27 ENCOUNTER — Ambulatory Visit: Payer: PPO | Admitting: Adult Health

## 2021-10-28 NOTE — Telephone Encounter (Signed)
Spoke with Zenia Resides at pharmacy, gave VO for early release of medication, he stated it was up to insurance. While on the phone, he did try to see if it could be filled early, but it was rejected stating could not fill before 10/03. Capped Fantasha, no answer, left msg on VM informing her early refill was rejected.

## 2021-11-10 DIAGNOSIS — H524 Presbyopia: Secondary | ICD-10-CM | POA: Diagnosis not present

## 2021-11-10 DIAGNOSIS — H43813 Vitreous degeneration, bilateral: Secondary | ICD-10-CM | POA: Diagnosis not present

## 2021-11-10 DIAGNOSIS — H35413 Lattice degeneration of retina, bilateral: Secondary | ICD-10-CM | POA: Diagnosis not present

## 2021-11-10 DIAGNOSIS — H04123 Dry eye syndrome of bilateral lacrimal glands: Secondary | ICD-10-CM | POA: Diagnosis not present

## 2021-11-10 DIAGNOSIS — H26492 Other secondary cataract, left eye: Secondary | ICD-10-CM | POA: Diagnosis not present

## 2021-11-10 DIAGNOSIS — H5000 Unspecified esotropia: Secondary | ICD-10-CM | POA: Diagnosis not present

## 2021-11-10 DIAGNOSIS — H18591 Other hereditary corneal dystrophies, right eye: Secondary | ICD-10-CM | POA: Diagnosis not present

## 2021-11-10 DIAGNOSIS — H52203 Unspecified astigmatism, bilateral: Secondary | ICD-10-CM | POA: Diagnosis not present

## 2021-12-24 ENCOUNTER — Emergency Department (HOSPITAL_COMMUNITY): Payer: PPO

## 2021-12-24 ENCOUNTER — Emergency Department (HOSPITAL_COMMUNITY)
Admission: EM | Admit: 2021-12-24 | Discharge: 2021-12-24 | Disposition: A | Discharge status: Home / Self Care | Payer: PPO | Attending: Emergency Medicine | Admitting: Emergency Medicine

## 2021-12-24 DIAGNOSIS — E876 Hypokalemia: Secondary | ICD-10-CM | POA: Insufficient documentation

## 2021-12-24 DIAGNOSIS — R112 Nausea with vomiting, unspecified: Secondary | ICD-10-CM | POA: Diagnosis not present

## 2021-12-24 DIAGNOSIS — N3000 Acute cystitis without hematuria: Secondary | ICD-10-CM | POA: Insufficient documentation

## 2021-12-24 DIAGNOSIS — R197 Diarrhea, unspecified: Secondary | ICD-10-CM | POA: Diagnosis not present

## 2021-12-24 DIAGNOSIS — I700 Atherosclerosis of aorta: Secondary | ICD-10-CM | POA: Diagnosis not present

## 2021-12-24 DIAGNOSIS — Z79899 Other long term (current) drug therapy: Secondary | ICD-10-CM | POA: Diagnosis not present

## 2021-12-24 DIAGNOSIS — R109 Unspecified abdominal pain: Secondary | ICD-10-CM | POA: Diagnosis not present

## 2021-12-24 LAB — URINALYSIS, ROUTINE W REFLEX MICROSCOPIC
Bilirubin Urine: NEGATIVE
Glucose, UA: NEGATIVE mg/dL
Ketones, ur: 5 mg/dL — AB
Nitrite: NEGATIVE
Protein, ur: 100 mg/dL — AB
Specific Gravity, Urine: 1.024 (ref 1.005–1.030)
pH: 5 (ref 5.0–8.0)

## 2021-12-24 LAB — COMPREHENSIVE METABOLIC PANEL
ALT: 26 U/L (ref 0–44)
AST: 42 U/L — ABNORMAL HIGH (ref 15–41)
Albumin: 4.8 g/dL (ref 3.5–5.0)
Alkaline Phosphatase: 64 U/L (ref 38–126)
Anion gap: 11 (ref 5–15)
BUN: 32 mg/dL — ABNORMAL HIGH (ref 8–23)
CO2: 26 mmol/L (ref 22–32)
Calcium: 10.8 mg/dL — ABNORMAL HIGH (ref 8.9–10.3)
Chloride: 99 mmol/L (ref 98–111)
Creatinine, Ser: 1.27 mg/dL — ABNORMAL HIGH (ref 0.44–1.00)
GFR, Estimated: 44 mL/min — ABNORMAL LOW (ref >60)
Glucose, Bld: 142 mg/dL — ABNORMAL HIGH (ref 70–99)
Potassium: 2.8 mmol/L — ABNORMAL LOW (ref 3.5–5.1)
Sodium: 136 mmol/L (ref 135–145)
Total Bilirubin: 0.9 mg/dL (ref 0.3–1.2)
Total Protein: 8.8 g/dL — ABNORMAL HIGH (ref 6.5–8.1)

## 2021-12-24 LAB — CBC
HCT: 35.8 % — ABNORMAL LOW (ref 36.0–46.0)
Hemoglobin: 12 g/dL (ref 12.0–15.0)
MCH: 30.8 pg (ref 26.0–34.0)
MCHC: 33.5 g/dL (ref 30.0–36.0)
MCV: 92 fL (ref 80.0–100.0)
Platelets: 270 10*3/uL (ref 150–400)
RBC: 3.89 MIL/uL (ref 3.87–5.11)
RDW: 13.2 % (ref 11.5–15.5)
WBC: 11.7 10*3/uL — ABNORMAL HIGH (ref 4.0–10.5)
nRBC: 0 % (ref 0.0–0.2)

## 2021-12-24 LAB — LIPASE, BLOOD: Lipase: 47 U/L (ref 11–51)

## 2021-12-24 LAB — MAGNESIUM: Magnesium: 1.8 mg/dL (ref 1.7–2.4)

## 2021-12-24 MED ORDER — FAMOTIDINE IN NACL 20-0.9 MG/50ML-% IV SOLN
20.0000 mg | Freq: Once | INTRAVENOUS | Status: AC
  Administered 2021-12-24: 20 mg via INTRAVENOUS
  Filled 2021-12-24: qty 50

## 2021-12-24 MED ORDER — METOCLOPRAMIDE HCL 5 MG/ML IJ SOLN
10.0000 mg | Freq: Once | INTRAMUSCULAR | Status: AC
  Administered 2021-12-24: 10 mg via INTRAVENOUS
  Filled 2021-12-24: qty 2

## 2021-12-24 MED ORDER — POTASSIUM CHLORIDE 10 MEQ/100ML IV SOLN
10.0000 meq | INTRAVENOUS | Status: AC
  Administered 2021-12-24 (×2): 10 meq via INTRAVENOUS
  Filled 2021-12-24 (×2): qty 100

## 2021-12-24 MED ORDER — SULFAMETHOXAZOLE-TRIMETHOPRIM 800-160 MG PO TABS: 1.0000 | ORAL_TABLET | Freq: Two times a day (BID) | ORAL | 0 refills | Status: AC

## 2021-12-24 MED ORDER — SULFAMETHOXAZOLE-TRIMETHOPRIM 800-160 MG PO TABS
1.0000 | ORAL_TABLET | Freq: Once | ORAL | Status: AC
  Administered 2021-12-24: 1 via ORAL
  Filled 2021-12-24: qty 1

## 2021-12-24 MED ORDER — SODIUM CHLORIDE 0.9 % IV BOLUS
1000.0000 mL | Freq: Once | INTRAVENOUS | Status: AC
  Administered 2021-12-24: 1000 mL via INTRAVENOUS

## 2021-12-24 MED ORDER — METOCLOPRAMIDE HCL 10 MG PO TABS: 10.0000 mg | ORAL_TABLET | Freq: Four times a day (QID) | ORAL | 0 refills | Status: DC | PRN

## 2021-12-24 MED ORDER — POTASSIUM CHLORIDE CRYS ER 20 MEQ PO TBCR: 20.0000 meq | EXTENDED_RELEASE_TABLET | Freq: Every day | ORAL | 0 refills | Status: DC

## 2021-12-24 MED ORDER — POTASSIUM CHLORIDE CRYS ER 20 MEQ PO TBCR
40.0000 meq | EXTENDED_RELEASE_TABLET | Freq: Once | ORAL | Status: AC
  Administered 2021-12-24: 40 meq via ORAL
  Filled 2021-12-24: qty 2

## 2021-12-24 MED ORDER — IOHEXOL 300 MG/ML  SOLN
80.0000 mL | Freq: Once | INTRAMUSCULAR | Status: AC | PRN
  Administered 2021-12-24: 80 mL via INTRAVENOUS

## 2021-12-24 MED ORDER — ONDANSETRON HCL 4 MG/2ML IJ SOLN
4.0000 mg | Freq: Once | INTRAMUSCULAR | Status: AC
  Administered 2021-12-24: 4 mg via INTRAVENOUS
  Filled 2021-12-24: qty 2

## 2021-12-24 MED ORDER — ONDANSETRON HCL 4 MG/2ML IJ SOLN
4.0000 mg | Freq: Once | INTRAMUSCULAR | Status: DC
  Administered 2021-12-24: 4 mg via INTRAVENOUS
  Filled 2021-12-24: qty 2

## 2021-12-24 NOTE — ED Triage Notes (Signed)
Patient here from home reporting ongoing n/v/d - x3 days. Zofran with no relief.

## 2021-12-24 NOTE — ED Notes (Addendum)
Pt verbalized understanding of discharge instructions. Opportunity for questions provided.   Pt attempted to sign for d/c but pad did not work.  

## 2021-12-24 NOTE — ED Provider Notes (Signed)
[facility]-EMERGENCY DEPT Provider Note   CSN: [id number] Arrival date & time: 12/24/21  [id number]     History  Chief Complaint  Patient presents with   Emesis   Diarrhea    Pamela Kim is a 76 y.o. female.  Patient is a 76 year old female who presents with nausea vomiting diarrhea.  She said the diarrhea started 2 days ago and then subsequently developed nausea and vomiting.  She says this is happened to her in the past.  When she starts with the diarrhea, she typically gets the nausea and vomiting.  Yesterday she was feeling little bit better and drink Gatorade.  Today she has had some intense nausea and has had some pain in her lower abdomen.  No fevers.  No urinary symptoms.  No blood in her stool or emesis.       Home Medications Prior to Admission medications   Medication Sig Start Date End Date Taking? Authorizing Provider  metoCLOPramide (REGLAN) 10 MG tablet Take 1 tablet (10 mg total) by mouth every 6 (six) hours as needed for nausea. 12/24/21  Yes Rolan BuccoBelfi, Annison Birchard, MD  potassium chloride SA (KLOR-CON M) 20 MEQ tablet Take 1 tablet (20 mEq total) by mouth daily. 12/24/21  Yes Rolan BuccoBelfi, Malaya Cagley, MD  sulfamethoxazole-trimethoprim (BACTRIM DS) 800-160 MG tablet Take 1 tablet by mouth 2 (two) times daily for 7 days. 12/24/21 12/31/21 Yes Rolan BuccoBelfi, Selby Foisy, MD  b complex vitamins capsule Take 1 capsule by mouth daily.    [provider]  Beta Carotene (VITAMIN A) 25000 UNIT capsule Take 25,000 Units by mouth daily.    [provider]  Cholecalciferol (VITAMIN D3) 1.25 MG (50000 UT) CAPS Take by mouth.    [provider]  co-enzyme Q-10 30 MG capsule Take 30 mg by mouth 3 (three) times daily.    [provider]  losartan-hydrochlorothiazide (HYZAAR) 50-12.5 MG tablet Take 1 tablet by mouth daily. 02/27/21   Nafziger, Kandee Keenory, NP  MAGNESIUM GLYCINATE PO Take by mouth.    [provider]  Omega-3 1000 MG CAPS Take by  mouth. Advance Omega    [provider]  ondansetron (ZOFRAN-ODT) 4 MG disintegrating tablet Take 4 mg by mouth every 8 (eight) hours as needed for nausea or vomiting.    [provider]  simvastatin (ZOCOR) 10 MG tablet Take 1 tablet (10 mg total) by mouth at bedtime. 09/02/21   Nafziger, Kandee Keenory, NP  thyroid [facility](ARMOUR THYROID) 60 MG tablet Take 1 tablet (60 mg total) by mouth daily before breakfast. 02/25/21   Nafziger, Kandee Keenory, NP  Turmeric (QC TUMERIC COMPLEX PO) Take by mouth.    [provider]  VITAMIN K PO Take 120 mcg by mouth daily.    [provider]      Allergies    Aspirin, Keflex [cephalexin], Neo-synephrine [phenylephrine hcl (pressors)], Penicillins, and Wal-profen d cold & sinus [pseudoephedrine-ibuprofen]    Review of Systems   Review of Systems  Constitutional:  Negative for chills, diaphoresis, fatigue and fever.  HENT:  Negative for congestion, rhinorrhea and sneezing.   Eyes: Negative.   Respiratory:  Negative for cough, chest tightness and shortness of breath.   Cardiovascular:  Negative for chest pain and leg swelling.  Gastrointestinal:  Positive for abdominal pain, diarrhea, nausea and vomiting. Negative for blood in stool.  Genitourinary:  Negative for difficulty urinating, flank pain, frequency and hematuria.  Musculoskeletal:  Negative for arthralgias and back pain.  Skin:  Negative for rash.  Neurological:  Negative for dizziness, speech difficulty, weakness, numbness and headaches.    Physical Exam Updated Vital Signs BP (!) 142/94   Pulse 71   Temp 98.2 F (36.8 C) (Oral)   Resp 16   Ht 5\' 4"  (1.626 m)   Wt 59 kg   SpO2 98%   BMI 22.31 kg/m  Physical Exam Constitutional:      Appearance: She is well-developed.  HENT:     Head: Normocephalic and atraumatic.  Eyes:     Pupils: Pupils are equal, round, and reactive to light.  Cardiovascular:     Rate and Rhythm: Normal rate and regular rhythm.     Heart sounds:  Normal heart sounds.  Pulmonary:     Effort: Pulmonary effort is normal. No respiratory distress.     Breath sounds: Normal breath sounds. No wheezing or rales.  Chest:     Chest wall: No tenderness.  Abdominal:     General: Bowel sounds are normal.     Palpations: Abdomen is soft.     Tenderness: There is abdominal tenderness (Tenderness to the right lower abdomen). There is no guarding or rebound.  Musculoskeletal:        General: Normal range of motion.     Cervical back: Normal range of motion and neck supple.  Lymphadenopathy:     Cervical: No cervical adenopathy.  Skin:    General: Skin is warm and dry.     Findings: No rash.  Neurological:     Mental Status: She is alert and oriented to person, place, and time.     ED Results / Procedures / Treatments   Labs (all labs ordered are listed, but only abnormal results are displayed) Labs Reviewed  COMPREHENSIVE METABOLIC PANEL - Abnormal; Notable for the following components:      Result Value   Potassium 2.8 (*)    Glucose, Bld 142 (*)    BUN 32 (*)    Creatinine, Ser 1.27 (*)    Calcium 10.8 (*)    Total Protein 8.8 (*)    AST 42 (*)    GFR, Estimated 44 (*)    All other components within normal limits  CBC - Abnormal; Notable for the following components:   WBC 11.7 (*)    HCT 35.8 (*)    All other components within normal limits  URINALYSIS, ROUTINE W REFLEX MICROSCOPIC - Abnormal; Notable for the following components:   Hgb urine dipstick MODERATE (*)    Ketones, ur 5 (*)    Protein, ur 100 (*)    Leukocytes,Ua MODERATE (*)    Bacteria, UA RARE (*)    All other components within normal limits  URINE CULTURE  LIPASE, BLOOD  MAGNESIUM    EKG EKG Interpretation  Date/Time:  Thursday December 24 2021 08:42:08 EST Ventricular Rate:  80 PR Interval:  156 QRS Duration: 102 QT Interval:  402 QTC Calculation: 464 R Axis:   43 Text Interpretation: Sinus rhythm Confirmed by 10[birth year]21[birth year]1978 [phone]) on  12/24/2021 12:29:45 PM  Radiology CT Abdomen Pelvis W Contrast  Result Date: 12/24/2021 CLINICAL DATA:  Right lower quadrant abdominal pain EXAM: CT ABDOMEN AND PELVIS WITH CONTRAST TECHNIQUE: Multidetector CT imaging of the abdomen and pelvis was performed using the standard protocol following bolus administration of intravenous contrast. RADIATION DOSE REDUCTION: This exam was performed according to the departmental dose-optimization program which includes automated exposure control, adjustment of the mA and/or kV according to patient size and/or use of iterative reconstruction technique. CONTRAST:  48mL OMNIPAQUE IOHEXOL 300 MG/ML  SOLN COMPARISON:  None Available. FINDINGS: Lower chest: No pleural fluid identified.  Lung bases appear clear. Hepatobiliary: There are several scattered subcentimeter low-density foci within the liver which are too small to reliably characterize. No suspicious liver lesion. Gallbladder appears normal. No bile duct dilatation. Pancreas: Unremarkable. No pancreatic ductal dilatation or surrounding inflammatory changes. Spleen: Normal in size without focal abnormality. Adrenals/Urinary Tract: Normal adrenal glands. No nephrolithiasis, hydronephrosis or suspicious kidney mass identified. Urinary bladder is unremarkable. Stomach/Bowel: The stomach appears normal. The appendix is visualized and is within normal limits, image 55/2. No pathologic dilatation of the large or small bowel loops. There is sigmoid diverticulosis. Wall thickening involving the sigmoid colon is identified without surrounding inflammatory fat stranding or free fluid. Vascular/Lymphatic: Aortic atherosclerosis. No enlarged abdominopelvic lymph nodes. Reproductive: Uterus and bilateral adnexa are unremarkable. Other: No free fluid or fluid collections identified. No signs of pneumoperitoneum. Musculoskeletal: Spondylosis identified within the lumbar spine. No acute or suspicious osseous findings. IMPRESSION: 1.  No acute findings identified within the abdomen or pelvis. 2. Sigmoid diverticulosis with wall thickening involving the sigmoid colon without surrounding inflammatory fat stranding or free fluid. Findings are favored to represent sequelae of chronic diverticulitis. 3. The appendix is visualized and is within normal limits. 4. No pathologic dilatation of the bile to suggest obstruction. 5.  Aortic Atherosclerosis (ICD10-I70.0). Electronically Signed   By: Signa Kell M.D.   On: 12/24/2021 10:17    Procedures Procedures    Medications Ordered in ED Medications  sodium chloride 0.9 % bolus 1,000 mL (0 mLs Intravenous Stopped 12/24/21 1031)  ondansetron (ZOFRAN) injection 4 mg (4 mg Intravenous Given 12/24/21 0848)  potassium chloride SA (KLOR-CON M) CR tablet 40 mEq (40 mEq Oral Given 12/24/21 1136)  potassium chloride 10 mEq in 100 mL IVPB (0 mEq Intravenous Stopped 12/24/21 1251)  iohexol (OMNIPAQUE) 300 MG/ML solution 80 mL (80 mLs Intravenous Contrast Given 12/24/21 0949)  metoCLOPramide (REGLAN) injection 10 mg (10 mg Intravenous Given 12/24/21 1252)  famotidine (PEPCID) IVPB 20 mg premix (0 mg Intravenous Stopped 12/24/21 1137)  sulfamethoxazole-trimethoprim (BACTRIM DS) 800-160 MG per tablet 1 tablet (1 tablet Oral Given 12/24/21 1328)    ED Course/ Medical Decision Making/ A&P                           Medical Decision Making Amount and/or Complexity of Data Reviewed Labs: ordered. Radiology: ordered.  Risk Prescription drug management.   Patient is a 76 year old female who presents with nausea vomiting diarrhea.  She states she has had the similar symptoms in the past.  She has had some pain in her right lower abdomen.  No significant urinary symptoms.  She was given IV fluids and antiemetics.  She had a CT scan of her abdomen pelvis which did not show any acute abnormality.  No evidence of appendicitis.  No evidence of bowel obstruction.  No colitis.  No diverticulitis which  appears acute.  Her urinalysis has some suggestions of infection.  It was sent for a culture.  Her other labs show a low potassium.  She was given potassium replacement both IV and oral.  Her creatinine is slightly elevated, likely from dehydration and she was given IV fluid replacement.  Overall she is feeling much better after treatment.  She is able to tolerate oral fluids without vomiting.  She has no ongoing abdominal pain.  She seemed to do better with the Reglan  versus the Zofran so we will discharge her with a prescription for Reglan as well as some antibiotics for her UTI.  She has multiple drug allergies and will try Bactrim.  She did not have any reaction to the Bactrim when she was administered in the ED today.  Will also prescribe her some potassium supplementation for the next few days.  She was advised to follow-up closely with her primary care doctor and advised that she will need to have her labs rechecked including her potassium and creatinine.  At this point I feel that she can be treated as an outpatient.  She does not have indication for hospitalization.  She was discharged home in good condition.  Return precautions were given.  Final Clinical Impression(s) / ED Diagnoses Final diagnoses:  Nausea vomiting and diarrhea  Hypokalemia  Acute cystitis without hematuria    Rx / DC Orders ED Discharge Orders          Ordered    metoCLOPramide (REGLAN) 10 MG tablet  Every 6 hours PRN        12/24/21 1427    potassium chloride SA (KLOR-CON M) 20 MEQ tablet  Daily        12/24/21 1427    sulfamethoxazole-trimethoprim (BACTRIM DS) 800-160 MG tablet  2 times daily        12/24/21 1427              Rolan Bucco, MD 12/24/21 1431

## 2021-12-24 NOTE — Discharge Instructions (Addendum)
Take the antibiotics as prescribed.  Make sure that you are staying hydrated.  Your potassium was low and needs to be rechecked by your primary care doctor within the next few days.  Your creatinine (kidney function) was slightly elevated and also needs to be rechecked.  Return to the emergency room if you have any worsening symptoms but otherwise follow-up with your primary care doctor within the next few days.

## 2021-12-24 NOTE — ED Notes (Signed)
ED Provider at bedside. 

## 2021-12-24 NOTE — ED Notes (Signed)
Pt seems to be tolerating water and PO potassium.

## 2021-12-25 LAB — URINE CULTURE

## 2021-12-30 ENCOUNTER — Encounter: Payer: Self-pay | Admitting: Adult Health

## 2021-12-30 ENCOUNTER — Ambulatory Visit (INDEPENDENT_AMBULATORY_CARE_PROVIDER_SITE_OTHER): Payer: PPO | Admitting: Adult Health

## 2021-12-30 VITALS — BP 120/80 | HR 85 | Temp 98.2°F | Ht 64.0 in | Wt 130.0 lb

## 2021-12-30 DIAGNOSIS — Z1211 Encounter for screening for malignant neoplasm of colon: Secondary | ICD-10-CM

## 2021-12-30 DIAGNOSIS — R112 Nausea with vomiting, unspecified: Secondary | ICD-10-CM | POA: Diagnosis not present

## 2021-12-30 DIAGNOSIS — R197 Diarrhea, unspecified: Secondary | ICD-10-CM | POA: Diagnosis not present

## 2021-12-30 DIAGNOSIS — E876 Hypokalemia: Secondary | ICD-10-CM

## 2021-12-30 MED ORDER — OMEPRAZOLE 20 MG PO CPDR: 20.0000 mg | DELAYED_RELEASE_CAPSULE | Freq: Every day | ORAL | 0 refills | Status: DC

## 2021-12-30 NOTE — Progress Notes (Signed)
Subjective:    Patient ID: Pamela Kim, female    DOB: 1945/06/02, 76 y.o.   MRN: [id number]  HPI 76 year old female who  has a past medical history of Hypertension and Hypothyroidism.  She presents to the office today for follow up after being seen in the ER  six days ago. She presented with nausea/vomiting/diarrhea. She reported that the diarrhea started two days prior and then developed nausea and vomiting. The day before going to the ER she was feeling better but the day of presentation she had some intense nausea and lower abdominal pain. She denies fevers, UTI symptoms, or blood in stool or emesis.   In the ER she had a CT scan of her abdomen and pelvis which did not show any acute obstruction.   Labs showed hypokalemia and she was replaced with this as well as IV fluids. Urinalysis showed concern for infection  Upon discharge she was feeling much better. Was able to tolerate oral fluids without vomiting. She had no ongoing abdominal pain. She was discharged on Reglan and Bactrim for suspected UTI   Today she reports that she is feeling better and is back to baseline. She has been having these symptoms for at least 25 years but it is becoming more frequent.    Review of Systems See HPI   Past Medical History:  Diagnosis Date   Hypertension    Hypothyroidism     Social History   Socioeconomic History   Marital status: Married    Spouse name: Not on file   Number of children: Not on file   Years of education: Not on file   Highest education level: Bachelor's degree (e.g., BA, AB, BS)  Occupational History   Not on file  Tobacco Use   Smoking status: Never   Smokeless tobacco: Never  Vaping Use   Vaping Use: Never used  Substance and Sexual Activity   Alcohol use: Yes    Comment: Twice yearly   Drug use: Not on file   Sexual activity: Not on file  Other Topics Concern   Not on file  Social History Narrative   Not on file   Social Determinants of Health    Financial Resource Strain: Low Risk  (09/21/2021)   Overall Financial Resource Strain (CARDIA)    Difficulty of Paying Living Expenses: Not hard at all  Food Insecurity: No Food Insecurity (09/21/2021)   Hunger Vital Sign    Worried About Running Out of Food in the Last Year: Never true    Ran Out of Food in the Last Year: Never true  Transportation Needs: No Transportation Needs (09/21/2021)   PRAPARE - Administrator, Civil Service (Medical): No    Lack of Transportation (Non-Medical): No  Physical Activity: Sufficiently Active (09/21/2021)   Exercise Vital Sign    Days of Exercise per Week: 4 days    Minutes of Exercise per Session: 60 min  Stress: No Stress Concern Present (09/21/2021)   [employer] of Occupational Health - Occupational Stress Questionnaire    Feeling of Stress : Only a little  Social Connections: Moderately Integrated (09/21/2021)   Social Connection and Isolation Panel [NHANES]    Frequency of Communication with Friends and Family: More than three times a week    Frequency of Social Gatherings with Friends and Family: More than three times a week    Attends Religious Services: More than 4 times per year    Active Member of [employer]  or Organizations: No    Attends Archivist Meetings: Not on file    Marital Status: Married  Human resources officer Violence: Not on file    Past Surgical History:  Procedure Laterality Date   CATARACT EXTRACTION, BILATERAL  2016   HAND SURGERY      Family History  Problem Relation Age of Onset   Asthma Mother    Mental illness Mother    Berenice Primas' disease Mother    COPD Mother    Heart murmur Father    Learning disabilities Sister    Hypothyroidism Sister    Hypothyroidism Sister     Allergies  Allergen Reactions   Aspirin Hives   Keflex [Cephalexin] Hives   Neo-Synephrine [Phenylephrine Hcl (Pressors)] Other (See Comments)    Migraine headaches    Penicillins Hives   Tape     Scotch tape    Wal-Profen D Cold & Sinus [Pseudoephedrine-Ibuprofen] Other (See Comments)    Current Outpatient Medications on File Prior to Visit  Medication Sig Dispense Refill   b complex vitamins capsule Take 1 capsule by mouth daily.     Beta Carotene (VITAMIN A) 25000 UNIT capsule Take 25,000 Units by mouth daily.     Cholecalciferol (VITAMIN D3) 1.25 MG (50000 UT) CAPS Take by mouth.     co-enzyme Q-10 30 MG capsule Take 30 mg by mouth 3 (three) times daily.     losartan-hydrochlorothiazide (HYZAAR) 50-12.5 MG tablet Take 1 tablet by mouth daily. 90 tablet 3   metoCLOPramide (REGLAN) 10 MG tablet Take 1 tablet (10 mg total) by mouth every 6 (six) hours as needed for nausea. 10 tablet 0   Omega-3 1000 MG CAPS Take by mouth. Advance Omega     ondansetron (ZOFRAN-ODT) 4 MG disintegrating tablet Take 4 mg by mouth every 8 (eight) hours as needed for nausea or vomiting.     simvastatin (ZOCOR) 10 MG tablet Take 1 tablet (10 mg total) by mouth at bedtime. 90 tablet 3   sulfamethoxazole-trimethoprim (BACTRIM DS) 800-160 MG tablet Take 1 tablet by mouth 2 (two) times daily for 7 days. 14 tablet 0   thyroid (ARMOUR THYROID) 60 MG tablet Take 1 tablet (60 mg total) by mouth daily before breakfast. 90 tablet 3   VITAMIN K PO Take 120 mcg by mouth daily.     No current facility-administered medications on file prior to visit.    BP 120/80   Pulse 85   Temp 98.2 F (36.8 C) (Oral)   Ht 5\' 4"  (1.626 m)   Wt 130 lb (59 kg)   SpO2 99%   BMI 22.31 kg/m       Objective:   Physical Exam Vitals and nursing note reviewed.  Constitutional:      Appearance: Normal appearance.  Cardiovascular:     Rate and Rhythm: Normal rate and regular rhythm.     Pulses: Normal pulses.     Heart sounds: Normal heart sounds.  Pulmonary:     Effort: Pulmonary effort is normal.     Breath sounds: Normal breath sounds.  Musculoskeletal:        General: Normal range of motion.  Skin:    General: Skin is warm and dry.      Capillary Refill: Capillary refill takes less than 2 seconds.  Neurological:     General: No focal deficit present.     Mental Status: She is alert and oriented to person, place, and time.  Psychiatric:  Mood and Affect: Mood normal.        Behavior: Behavior normal.        Thought Content: Thought content normal.        Judgment: Judgment normal.       Assessment & Plan:  1. Nausea and vomiting, unspecified vomiting type - possibly GERD related vs Cyclic vomiting syndrome.  - Will start on Prilosec 20 mg x 30 days  - She would like to be referred to GI for further evaluation  - Ambulatory referral to Gastroenterology  2. Diarrhea, unspecified type  - Ambulatory referral to Gastroenterology  3. Colon cancer screening - Unable to complete prep for colonoscopy  - Cologuard  4. Hypokalemia  - Basic Metabolic Panel; Future  Dorothyann Peng, NP

## 2021-12-31 ENCOUNTER — Other Ambulatory Visit: Payer: Self-pay | Admitting: Adult Health

## 2021-12-31 DIAGNOSIS — N289 Disorder of kidney and ureter, unspecified: Secondary | ICD-10-CM

## 2021-12-31 LAB — BASIC METABOLIC PANEL
BUN: 19 mg/dL (ref 6–23)
CO2: 29 mEq/L (ref 19–32)
Calcium: 10.3 mg/dL (ref 8.4–10.5)
Chloride: 97 mEq/L (ref 96–112)
Creatinine, Ser: 1.37 mg/dL — ABNORMAL HIGH (ref 0.40–1.20)
GFR: 38.03 mL/min — ABNORMAL LOW (ref >60.00)
Glucose, Bld: 84 mg/dL (ref 70–99)
Potassium: 5.4 mEq/L — ABNORMAL HIGH (ref 3.5–5.1)
Sodium: 133 mEq/L — ABNORMAL LOW (ref 135–145)

## 2022-01-01 ENCOUNTER — Encounter: Payer: Self-pay | Admitting: Adult Health

## 2022-01-04 ENCOUNTER — Other Ambulatory Visit (INDEPENDENT_AMBULATORY_CARE_PROVIDER_SITE_OTHER): Payer: PPO

## 2022-01-04 DIAGNOSIS — N289 Disorder of kidney and ureter, unspecified: Secondary | ICD-10-CM

## 2022-01-04 DIAGNOSIS — Z87440 Personal history of urinary (tract) infections: Secondary | ICD-10-CM

## 2022-01-04 LAB — BASIC METABOLIC PANEL
BUN: 16 mg/dL (ref 6–23)
CO2: 31 mEq/L (ref 19–32)
Calcium: 9.7 mg/dL (ref 8.4–10.5)
Chloride: 100 mEq/L (ref 96–112)
Creatinine, Ser: 1.03 mg/dL (ref 0.40–1.20)
GFR: 53.55 mL/min — ABNORMAL LOW (ref >60.00)
Glucose, Bld: 103 mg/dL — ABNORMAL HIGH (ref 70–99)
Potassium: 4.3 mEq/L (ref 3.5–5.1)
Sodium: 137 mEq/L (ref 135–145)

## 2022-01-05 ENCOUNTER — Encounter: Payer: Self-pay | Admitting: Adult Health

## 2022-01-05 ENCOUNTER — Other Ambulatory Visit: Payer: Self-pay | Admitting: Adult Health

## 2022-01-05 DIAGNOSIS — Z87440 Personal history of urinary (tract) infections: Secondary | ICD-10-CM

## 2022-01-05 LAB — URINALYSIS, ROUTINE W REFLEX MICROSCOPIC
Bilirubin Urine: NEGATIVE
Ketones, ur: NEGATIVE
Nitrite: NEGATIVE
RBC / HPF: NONE SEEN (ref 0–?)
Specific Gravity, Urine: 1.01 (ref 1.000–1.030)
Total Protein, Urine: NEGATIVE
Urine Glucose: NEGATIVE
Urobilinogen, UA: 0.2 (ref 0.0–1.0)
pH: 6 (ref 5.0–8.0)

## 2022-01-06 ENCOUNTER — Telehealth: Payer: Self-pay | Admitting: Adult Health

## 2022-01-06 NOTE — Telephone Encounter (Signed)
Updated patient on labs.   Kidney function is improving and about back to baseline - keep with hydrations measures.   UA shows trace blood ( has had for years) and leukocytes but no nitrates.  We discussed that blood in her urine chronically may be a sign of something or could be absolutely nothing.  She wishes to not pursue any further work-up for this issue at this time

## 2022-01-12 ENCOUNTER — Other Ambulatory Visit: Payer: PPO

## 2022-02-08 ENCOUNTER — Other Ambulatory Visit: Payer: Self-pay | Admitting: Adult Health

## 2022-03-04 DIAGNOSIS — Z1211 Encounter for screening for malignant neoplasm of colon: Secondary | ICD-10-CM | POA: Diagnosis not present

## 2022-03-15 LAB — COLOGUARD: COLOGUARD: NEGATIVE

## 2022-04-12 ENCOUNTER — Encounter: Payer: Self-pay | Admitting: Adult Health

## 2022-04-19 ENCOUNTER — Encounter: Payer: Self-pay | Admitting: Adult Health

## 2022-04-20 ENCOUNTER — Ambulatory Visit: Payer: PPO | Admitting: Adult Health

## 2022-04-20 NOTE — Telephone Encounter (Signed)
Please advise 

## 2022-04-27 ENCOUNTER — Encounter: Payer: Self-pay | Admitting: Adult Health

## 2022-04-27 ENCOUNTER — Ambulatory Visit (INDEPENDENT_AMBULATORY_CARE_PROVIDER_SITE_OTHER): Payer: Medicare HMO | Admitting: Adult Health

## 2022-04-27 VITALS — BP 140/74 | HR 75 | Temp 97.8°F | Ht 64.0 in | Wt 130.0 lb

## 2022-04-27 DIAGNOSIS — R5383 Other fatigue: Secondary | ICD-10-CM

## 2022-04-27 DIAGNOSIS — H6121 Impacted cerumen, right ear: Secondary | ICD-10-CM

## 2022-04-27 DIAGNOSIS — E039 Hypothyroidism, unspecified: Secondary | ICD-10-CM | POA: Diagnosis not present

## 2022-04-27 LAB — CBC
HCT: 34.4 % — ABNORMAL LOW (ref 36.0–46.0)
Hemoglobin: 11.5 g/dL — ABNORMAL LOW (ref 12.0–15.0)
MCHC: 33.4 g/dL (ref 30.0–36.0)
MCV: 92.1 fl (ref 78.0–100.0)
Platelets: 259 10*3/uL (ref 150.0–400.0)
RBC: 3.73 Mil/uL — ABNORMAL LOW (ref 3.87–5.11)
RDW: 13.4 % (ref 11.5–15.5)
WBC: 7.9 10*3/uL (ref 4.0–10.5)

## 2022-04-27 LAB — TSH: TSH: 3.53 u[IU]/mL (ref 0.35–5.50)

## 2022-04-27 LAB — IBC + FERRITIN
Ferritin: 69.2 ng/mL (ref 10.0–291.0)
Iron: 96 ug/dL (ref 42–145)
Saturation Ratios: 30.5 % (ref 20.0–50.0)
TIBC: 315 ug/dL (ref 250.0–450.0)
Transferrin: 225 mg/dL (ref 212.0–360.0)

## 2022-04-27 LAB — T3, FREE: T3, Free: 3.4 pg/mL (ref 2.3–4.2)

## 2022-04-27 LAB — T4, FREE: Free T4: 0.58 ng/dL — ABNORMAL LOW (ref 0.60–1.60)

## 2022-04-27 NOTE — Progress Notes (Addendum)
Subjective:    Patient ID: Pamela Kim, female    DOB: [birth year], 77 y.o.   MRN: [id number]  HPI 77 year old female who  has a past medical history of Hypertension and Hypothyroidism.  She presents to the office today for fatigue for the last two months. She would like to check her thyroid level since she is on Armour thyroid.  She does not eat red meat and does not eat a lot of root vegetables. Has a history of iron deficiency in the past.   She also feels like her right ear is clogged up. Has hearing loss. History of cerumen impaction in right ear     Review of Systems See HPI   Past Medical History:  Diagnosis Date   Hypertension    Hypothyroidism     Social History   Socioeconomic History   Marital status: Married    Spouse name: Not on file   Number of children: Not on file   Years of education: Not on file   Highest education level: Bachelor's degree (e.g., BA, AB, BS)  Occupational History   Not on file  Tobacco Use   Smoking status: Never   Smokeless tobacco: Never  Vaping Use   Vaping Use: Never used  Substance and Sexual Activity   Alcohol use: Yes    Comment: Twice yearly   Drug use: Not on file   Sexual activity: Not on file  Other Topics Concern   Not on file  Social History Narrative   Not on file   Social Determinants of Health   Financial Resource Strain: Low Risk  (09/21/2021)   Overall Financial Resource Strain (CARDIA)    Difficulty of Paying Living Expenses: Not hard at all  Food Insecurity: No Food Insecurity (09/21/2021)   Hunger Vital Sign    Worried About Running Out of Food in the Last Year: Never true    Ran Out of Food in the Last Year: Never true  Transportation Needs: No Transportation Needs (09/21/2021)   PRAPARE - Hydrologist (Medical): No    Lack of Transportation (Non-Medical): No  Physical Activity: Sufficiently Active (09/21/2021)   Exercise Vital Sign    Days of Exercise per Week: 4  days    Minutes of Exercise per Session: 60 min  Stress: No Stress Concern Present (09/21/2021)   Hope    Feeling of Stress : Only a little  Social Connections: Moderately Integrated (09/21/2021)   Social Connection and Isolation Panel [NHANES]    Frequency of Communication with Friends and Family: More than three times a week    Frequency of Social Gatherings with Friends and Family: More than three times a week    Attends Religious Services: More than 4 times per year    Active Member of [employer] or Organizations: No    Attends Music therapist: Not on file    Marital Status: Married  Human resources officer Violence: Not on file    Past Surgical History:  Procedure Laterality Date   CATARACT EXTRACTION, BILATERAL  2016   HAND SURGERY      Family History  Problem Relation Age of Onset   Asthma Mother    Mental illness Mother    Berenice Primas' disease Mother    COPD Mother    Heart murmur Father    Learning disabilities Sister    Hypothyroidism Sister    Hypothyroidism Sister  Allergies  Allergen Reactions   Aspirin Hives   Keflex [Cephalexin] Hives   Neo-Synephrine [Phenylephrine Hcl (Pressors)] Other (See Comments)    Migraine headaches    Penicillins Hives   Tape     Scotch tape   Wal-Profen D Cold & Sinus [Pseudoephedrine-Ibuprofen] Other (See Comments)    Current Outpatient Medications on File Prior to Visit  Medication Sig Dispense Refill   b complex vitamins capsule Take 1 capsule by mouth daily.     Beta Carotene (VITAMIN A) 25000 UNIT capsule Take 25,000 Units by mouth daily.     Cholecalciferol (VITAMIN D3) 1.25 MG (50000 UT) CAPS Take by mouth.     co-enzyme Q-10 30 MG capsule Take 30 mg by mouth 3 (three) times daily.     losartan-hydrochlorothiazide (HYZAAR) 50-12.5 MG tablet TAKE 1 TABLET BY MOUTH EVERY DAY 90 tablet 2   metoCLOPramide (REGLAN) 10 MG tablet Take 1 tablet (10 mg  total) by mouth every 6 (six) hours as needed for nausea. 10 tablet 0   Omega-3 1000 MG CAPS Take by mouth. Advance Omega     ondansetron (ZOFRAN-ODT) 4 MG disintegrating tablet Take 4 mg by mouth every 8 (eight) hours as needed for nausea or vomiting.     simvastatin (ZOCOR) 10 MG tablet Take 1 tablet (10 mg total) by mouth at bedtime. 90 tablet 3   thyroid (ARMOUR THYROID) 60 MG tablet Take 1 tablet (60 mg total) by mouth daily before breakfast. 90 tablet 3   VITAMIN K PO Take 120 mcg by mouth daily.     No current facility-administered medications on file prior to visit.    BP (!) 140/74 (BP Location: Left Arm, Patient Position: Sitting, Cuff Size: Normal)   Pulse 75   Temp 97.8 F (36.6 C) (Oral)   Ht 5\' 4"  (1.626 m)   Wt 130 lb (59 kg)   SpO2 97%   BMI 22.31 kg/m       Objective:   Physical Exam Vitals and nursing note reviewed.  Constitutional:      Appearance: Normal appearance.  HENT:     Right Ear: There is impacted cerumen.  Cardiovascular:     Rate and Rhythm: Normal rate and regular rhythm.     Pulses: Normal pulses.     Heart sounds: Normal heart sounds.  Pulmonary:     Effort: Pulmonary effort is normal.     Breath sounds: Normal breath sounds.  Skin:    General: Skin is warm and dry.  Neurological:     General: No focal deficit present.     Mental Status: She is alert and oriented to person, place, and time.  Psychiatric:        Mood and Affect: Mood normal.        Behavior: Behavior normal.        Thought Content: Thought content normal.        Judgment: Judgment normal.           Assessment & Plan:  1. Acquired hypothyroidism - Consider dose adjustment of armour thyroid - TSH; Future - T3, Free; Future - T4, Free; Future - CBC; Future - IBC + Ferritin; Future - IBC + Ferritin - CBC - T4, Free - T3, Free - TSH  2. Other fatigue  - TSH; Future - T3, Free; Future - T4, Free; Future - CBC; Future - IBC + Ferritin; Future - IBC +  Ferritin - CBC - T4, Free - T3, Free - TSH  3. Impacted cerumen of right ear Warm water was applied and gentle ear lavage performed to right ear.  There were no complications and following the disimpaction the tympanic membrane were visible.  Tympanic membranes was intact following the procedure.  Auditory canals are normal.  The patient reported relief of symptoms after removal of cerumen. Patient tolerated procedure well.    Dorothyann Peng, NP

## 2022-04-28 ENCOUNTER — Other Ambulatory Visit: Payer: Self-pay | Admitting: Adult Health

## 2022-04-28 DIAGNOSIS — E039 Hypothyroidism, unspecified: Secondary | ICD-10-CM

## 2022-04-28 NOTE — Telephone Encounter (Signed)
Pt notified of lab results. Pt stated that she will call is to schedule lab appt.

## 2022-05-08 ENCOUNTER — Other Ambulatory Visit: Payer: Self-pay | Admitting: Adult Health

## 2022-05-08 ENCOUNTER — Encounter: Payer: Self-pay | Admitting: Adult Health

## 2022-05-11 ENCOUNTER — Other Ambulatory Visit: Payer: Self-pay | Admitting: Adult Health

## 2022-05-11 MED ORDER — THYROID 60 MG PO TABS: 60.0000 mg | ORAL_TABLET | Freq: Every day | ORAL | 1 refills | Status: DC

## 2022-05-11 NOTE — Telephone Encounter (Signed)
Please advise 

## 2022-05-24 ENCOUNTER — Encounter: Payer: Self-pay | Admitting: Adult Health

## 2022-07-02 ENCOUNTER — Telehealth: Payer: Medicare HMO | Admitting: Physician Assistant

## 2022-07-02 DIAGNOSIS — U071 COVID-19: Secondary | ICD-10-CM | POA: Diagnosis not present

## 2022-07-02 MED ORDER — NIRMATRELVIR/RITONAVIR (PAXLOVID) TABLET (RENAL DOSING): 2.0000 | ORAL_TABLET | Freq: Two times a day (BID) | ORAL | 0 refills | Status: AC

## 2022-07-02 MED ORDER — PREDNISONE 20 MG PO TABS
40.0000 mg | ORAL_TABLET | Freq: Every day | ORAL | 0 refills | Status: DC
Start: 2022-07-02 — End: 2022-07-29

## 2022-07-02 MED ORDER — BENZONATATE 100 MG PO CAPS
100.0000 mg | ORAL_CAPSULE | Freq: Three times a day (TID) | ORAL | 0 refills | Status: DC | PRN
Start: 2022-07-02 — End: 2022-07-29

## 2022-07-02 NOTE — Patient Instructions (Signed)
[place], thank you for joining Margaretann Loveless, PA-C for today's virtual visit.  While this provider is not your primary care provider (PCP), if your PCP is located in our provider database this encounter information will be shared with them immediately following your visit.   A [facility] MyChart account gives you access to today's visit and all your visits, tests, and labs performed at [facility] " click here if you don't have a [facility] MyChart account or go to [email or web address]  Consent: (Patient) Pamela Kim provided verbal consent for this virtual visit at the beginning of the encounter.  Current Medications:  Current Outpatient Medications:    benzonatate (TESSALON) 100 MG capsule, Take 1 capsule (100 mg total) by mouth 3 (three) times daily as needed., Disp: 30 capsule, Rfl: 0   nirmatrelvir/ritonavir, renal dosing, (PAXLOVID) 10 x 150 MG & 10 x 100MG  TABS, Take 2 tablets by mouth 2 (two) times daily for 5 days. (Take nirmatrelvir 150 mg one tablet twice daily for 5 days and ritonavir 100 mg one tablet twice daily for 5 days) Patient GFR is 53, Disp: 20 tablet, Rfl: 0   predniSONE (DELTASONE) 20 MG tablet, Take 2 tablets (40 mg total) by mouth daily with breakfast., Disp: 10 tablet, Rfl: 0   b complex vitamins capsule, Take 1 capsule by mouth daily., Disp: , Rfl:    Beta Carotene (VITAMIN A) 25000 UNIT capsule, Take 25,000 Units by mouth daily., Disp: , Rfl:    Cholecalciferol (VITAMIN D3) 1.25 MG (50000 UT) CAPS, Take by mouth., Disp: , Rfl:    co-enzyme Q-10 30 MG capsule, Take 30 mg by mouth 3 (three) times daily., Disp: , Rfl:    losartan-hydrochlorothiazide (HYZAAR) 50-12.5 MG tablet, TAKE 1 TABLET BY MOUTH EVERY DAY, Disp: 90 tablet, Rfl: 2   metoCLOPramide (REGLAN) 10 MG tablet, Take 1 tablet (10 mg total) by mouth every 6 (six) hours as needed for nausea., Disp: 10 tablet, Rfl: 0   Omega-3 1000 MG CAPS, Take by mouth. Advance Omega,  Disp: , Rfl:    ondansetron (ZOFRAN-ODT) 4 MG disintegrating tablet, Take 4 mg by mouth every 8 (eight) hours as needed for nausea or vomiting., Disp: , Rfl:    simvastatin (ZOCOR) 10 MG tablet, Take 1 tablet (10 mg total) by mouth at bedtime., Disp: 90 tablet, Rfl: 3   thyroid (ARMOUR THYROID) 60 MG tablet, Take 1 tablet (60 mg total) by mouth daily before breakfast., Disp: 90 tablet, Rfl: 1   VITAMIN K PO, Take 120 mcg by mouth daily., Disp: , Rfl:    Medications ordered in this encounter:  Meds ordered this encounter  Medications   nirmatrelvir/ritonavir, renal dosing, (PAXLOVID) 10 x 150 MG & 10 x 100MG  TABS    Sig: Take 2 tablets by mouth 2 (two) times daily for 5 days. (Take nirmatrelvir 150 mg one tablet twice daily for 5 days and ritonavir 100 mg one tablet twice daily for 5 days) Patient GFR is 53    Dispense:  20 tablet    Refill:  0    Order Specific Question:   Supervising Provider    Answer:   Merrilee Jansky [[id number]]   predniSONE (DELTASONE) 20 MG tablet    Sig: Take 2 tablets (40 mg total) by mouth daily with breakfast.    Dispense:  10 tablet    Refill:  0    Order Specific Question:   Supervising Provider    Answer:   Leonides Grills,  PHILIP O [[id number]]   benzonatate (TESSALON) 100 MG capsule    Sig: Take 1 capsule (100 mg total) by mouth 3 (three) times daily as needed.    Dispense:  30 capsule    Refill:  0    Order Specific Question:   Supervising Provider    Answer:   Merrilee Jansky [id number]     *If you need refills on other medications prior to your next appointment, please contact your pharmacy*  Follow-Up: Call back or seek an in-person evaluation if the symptoms worsen or if the condition fails to improve as anticipated.  [facility] [phone]  Care Instructions: Nirmatrelvir; Ritonavir Tablets What is this medication? NIRMATRELVIR; RITONAVIR (NIR ma TREL vir; ri TOE na veer) treats mild to moderate COVID-19. It may help people who are  at high risk of developing severe illness. It works by limiting the spread of the virus in your body. This medicine may be used for other purposes; ask your health care provider or pharmacist if you have questions. COMMON BRAND NAME(S): PAXLOVID What should I tell my care team before I take this medication? They need to know if you have any of these conditions: Any allergies Any serious illness Kidney disease Liver disease An unusual or allergic reaction to nirmatrelvir, ritonavir, other medications, foods, dyes, or preservatives Pregnant or trying to get pregnant Breast-feeding How should I use this medication? This product contains 2 different medications that are packaged together. For the standard dose, take 2 pink tablets of nirmatrelvir with 1 white tablet of ritonavir (3 tablets total) by mouth with water twice daily. Talk to your care team if you have kidney disease. You may need a different dose. Swallow the tablets whole. You can take it with or without food. If it upsets your stomach, take it with food. Take all of this medication unless your care team tells you to stop it early. Keep taking it even if you think you are better. Talk to your care team about the use of this medication in children. While it may be prescribed for children as young as 12 years for selected conditions, precautions do apply. Overdosage: If you think you have taken too much of this medicine contact a poison control center or emergency room at once. NOTE: This medicine is only for you. Do not share this medicine with others. What if I miss a dose? If you miss a dose, take it as soon as you can unless it is more than 8 hours late. If it is more than 8 hours late, skip the missed dose. Take the next dose at the normal time. Do not take extra or 2 doses at the same time to make up for the missed dose. What may interact with this medication? Do not take this medication with any of the following: Alfuzosin Certain  medications for anxiety or sleep, such as midazolam or triazolam Certain medications for cancer, such as apalutamide Certain medications for cholesterol, such as lovastatin or simvastatin Certain medications for irregular heartbeat, such as amiodarone, dronedarone, flecainide, propafenone, quinidine Certain medications for mental health conditions, such as lurasidone or pimozide Certain medications for seizures, such as carbamazepine, phenobarbital, phenytoin, primidone Colchicine Eletriptan Eplerenone Ergot alkaloids, such as dihydroergotamine, ergotamine, methylergonovine Finerenone Flibanserin Ivabradine Lomitapide Lumacaftor; ivacaftor Naloxegol Ranolazine Red Yeast Rice Rifampin Rifapentine Sildenafil Silodosin [facility]. John's wort Tolvaptan Ubrogepant Voclosporin This medication may affect how other medications work, and other medications may affect the way this medication works. Talk with  your care team about all of the medications you take. They may suggest changes to your treatment plan to lower the risk of side effects and to make sure your medications work as intended. This list may not describe all possible interactions. Give your health care provider a list of all the medicines, herbs, non-prescription drugs, or dietary supplements you use. Also tell them if you smoke, drink alcohol, or use illegal drugs. Some items may interact with your medicine. What should I watch for while using this medication? Your condition will be monitored carefully while you are receiving this medication. Visit your care team for regular checkups. Tell your care team if your symptoms do not start to get better or if they get worse. If you have untreated HIV infection, this medication may lead to some HIV medications not working as well in the future. Estrogen and progestin hormones may not work as well while you are taking this medication. Your care team can help you find the contraceptive option  that works for you. What side effects may I notice from receiving this medication? Side effects that you should report to your care team as soon as possible: Allergic reactions--skin rash, itching, hives, swelling of the face, lips, tongue, or throat Liver injury--right upper belly pain, loss of appetite, nausea, light-colored stool, dark yellow or brown urine, yellowing skin or eyes, unusual weakness or fatigue Redness, blistering, peeling, or loosening of the skin, including inside the mouth Side effects that usually do not require medical attention (report these to your care team if they continue or are bothersome): Change in taste Diarrhea General discomfort and fatigue Increase in blood pressure Muscle pain Nausea Stomach pain This list may not describe all possible side effects. Call your doctor for medical advice about side effects. You may report side effects to FDA at 1-800-FDA-1088. Where should I keep my medication? Keep out of the reach of children and pets. Store at room temperature between 20 and 25 degrees C (68 and 77 degrees F). Get rid of any unused medication after the expiration date. To get rid of medications that are no longer needed or have expired: Take the medication to a medication take-back program. Check with your pharmacy or law enforcement to find a location. If you cannot return the medication, check the label or package insert to see if the medication should be thrown out in the garbage or flushed down the toilet. If you are not sure, ask your care team. If it is safe to put it in the trash, take the medication out of the container. Mix the medication with cat litter, dirt, coffee grounds, or other unwanted substance. Seal the mixture in a bag or container. Put it in the trash. NOTE: This sheet is a summary. It may not cover all possible information. If you have questions about this medicine, talk to your doctor, pharmacist, or health care provider.  2024  Elsevier/Gold Standard (2022-03-28 00:00:00)    Isolation Instructions: You are to isolate at home until you have been fever free for at least 24 hours without a fever-reducing medication, and symptoms have been steadily improving for 24 hours. At that time,  you can end isolation but need to mask for an additional 5 days.   If you must be around other household members who do not have symptoms, you need to make sure that both you and the family members are masking consistently with a high-quality mask.  If you note any worsening of symptoms despite treatment,  please seek an in-person evaluation ASAP. If you note any significant shortness of breath or any chest pain, please seek ER evaluation. Please do not delay care!   COVID-19: What to Do if You Are Sick If you test positive and are an older adult or someone who is at high risk of getting very sick from COVID-19, treatment may be available. Contact a healthcare provider right away after a positive test to determine if you are eligible, even if your symptoms are mild right now. You can also visit a Test to Treat location and, if eligible, receive a prescription from a provider. Don't delay: Treatment must be started within the first few days to be effective. If you have a fever, cough, or other symptoms, you might have COVID-19. Most people have mild illness and are able to recover at home. If you are sick: Keep track of your symptoms. If you have an emergency warning sign (including trouble breathing), call 911. Steps to help prevent the spread of COVID-19 if you are sick If you are sick with COVID-19 or think you might have COVID-19, follow the steps below to care for yourself and to help protect other people in your home and community. Stay home except to get medical care Stay home. Most people with COVID-19 have mild illness and can recover at home without medical care. Do not leave your home, except to get medical care. Do not visit public  areas and do not go to places where you are unable to wear a mask. Take care of yourself. Get rest and stay hydrated. Take over-the-counter medicines, such as acetaminophen, to help you feel better. Stay in touch with your doctor. Call before you get medical care. Be sure to get care if you have trouble breathing, or have any other emergency warning signs, or if you think it is an emergency. Avoid public transportation, ride-sharing, or taxis if possible. Get tested If you have symptoms of COVID-19, get tested. While waiting for test results, stay away from others, including staying apart from those living in your household. Get tested as soon as possible after your symptoms start. Treatments may be available for people with COVID-19 who are at risk for becoming very sick. Don't delay: Treatment must be started early to be effective--some treatments must begin within 5 days of your first symptoms. Contact your healthcare provider right away if your test result is positive to determine if you are eligible. Self-tests are one of several options for testing for the virus that causes COVID-19 and may be more convenient than laboratory-based tests and point-of-care tests. Ask your healthcare provider or your local health department if you need help interpreting your test results. You can visit your state, tribal, local, and territorial health department's website to look for the latest local information on testing sites. Separate yourself from other people As much as possible, stay in a specific room and away from other people and pets in your home. If possible, you should use a separate bathroom. If you need to be around other people or animals in or outside of the home, wear a well-fitting mask. Tell your close contacts that they may have been exposed to COVID-19. An infected person can spread COVID-19 starting 48 hours (or 2 days) before the person has any symptoms or tests positive. By letting your close  contacts know they may have been exposed to COVID-19, you are helping to protect everyone. See COVID-19 and Animals if you have questions about pets. If you  are diagnosed with COVID-19, someone from the health department may call you. Answer the call to slow the spread. Monitor your symptoms Symptoms of COVID-19 include fever, cough, or other symptoms. Follow care instructions from your healthcare provider and local health department. Your local health authorities may give instructions on checking your symptoms and reporting information. When to seek emergency medical attention Look for emergency warning signs* for COVID-19. If someone is showing any of these signs, seek emergency medical care immediately: Trouble breathing Persistent pain or pressure in the chest New confusion Inability to wake or stay awake Pale, gray, or blue-colored skin, lips, or nail beds, depending on skin tone *This list is not all possible symptoms. Please call your medical provider for any other symptoms that are severe or concerning to you. Call 911 or call ahead to your local emergency facility: Notify the operator that you are seeking care for someone who has or may have COVID-19. Call ahead before visiting your doctor Call ahead. Many medical visits for routine care are being postponed or done by phone or telemedicine. If you have a medical appointment that cannot be postponed, call your doctor's office, and tell them you have or may have COVID-19. This will help the office protect themselves and other patients. If you are sick, wear a well-fitting mask You should wear a mask if you must be around other people or animals, including pets (even at home). Wear a mask with the best fit, protection, and comfort for you. You don't need to wear the mask if you are alone. If you can't put on a mask (because of trouble breathing, for example), cover your coughs and sneezes in some other way. Try to stay at least 6 feet away  from other people. This will help protect the people around you. Masks should not be placed on young children under age 79 years, anyone who has trouble breathing, or anyone who is not able to remove the mask without help. Cover your coughs and sneezes Cover your mouth and nose with a tissue when you cough or sneeze. Throw away used tissues in a lined trash can. Immediately wash your hands with soap and water for at least 20 seconds. If soap and water are not available, clean your hands with an alcohol-based hand sanitizer that contains at least 60% alcohol. Clean your hands often Wash your hands often with soap and water for at least 20 seconds. This is especially important after blowing your nose, coughing, or sneezing; going to the bathroom; and before eating or preparing food. Use hand sanitizer if soap and water are not available. Use an alcohol-based hand sanitizer with at least 60% alcohol, covering all surfaces of your hands and rubbing them together until they feel dry. Soap and water are the best option, especially if hands are visibly dirty. Avoid touching your eyes, nose, and mouth with unwashed hands. Handwashing Tips Avoid sharing personal household items Do not share dishes, drinking glasses, cups, eating utensils, towels, or bedding with other people in your home. Wash these items thoroughly after using them with soap and water or put in the dishwasher. Clean surfaces in your home regularly Clean and disinfect high-touch surfaces (for example, doorknobs, tables, handles, light switches, and countertops) in your "sick room" and bathroom. In shared spaces, you should clean and disinfect surfaces and items after each use by the person who is ill. If you are sick and cannot clean, a caregiver or other person should only clean and disinfect the area  around you (such as your bedroom and bathroom) on an as needed basis. Your caregiver/other person should wait as long as possible (at least  several hours) and wear a mask before entering, cleaning, and disinfecting shared spaces that you use. Clean and disinfect areas that may have blood, stool, or body fluids on them. Use household cleaners and disinfectants. Clean visible dirty surfaces with household cleaners containing soap or detergent. Then, use a household disinfectant. Use a product from [employer] List N: Disinfectants for Coronavirus (COVID-19). Be sure to follow the instructions on the label to ensure safe and effective use of the product. Many products recommend keeping the surface wet with a disinfectant for a certain period of time (look at "contact time" on the product label). You may also need to wear personal protective equipment, such as gloves, depending on the directions on the product label. Immediately after disinfecting, wash your hands with soap and water for 20 seconds. For completed guidance on cleaning and disinfecting your home, visit Complete Disinfection Guidance. Take steps to improve ventilation at home Improve ventilation (air flow) at home to help prevent from spreading COVID-19 to other people in your household. Clear out COVID-19 virus particles in the air by opening windows, using air filters, and turning on fans in your home. Use this interactive tool to learn how to improve air flow in your home. When you can be around others after being sick with COVID-19 Deciding when you can be around others is different for different situations. Find out when you can safely end home isolation. For any additional questions about your care, contact your healthcare provider or state or local health department. 04/22/2020 Content source: [facility] for Immunization and Respiratory Diseases ([facility]), Division of Viral Diseases This information is not intended to replace advice given to you by your health care provider. Make sure you discuss any questions you have with your health care provider. Document Revised:  06/05/2020 Document Reviewed: 06/05/2020 Elsevier Patient Education  2022 [employer].     If you have been instructed to have an in-person evaluation today at a local Urgent Care facility, please use the link below. It will take you to a list of all of our available [facility], including address, phone number and hours of operation. Please do not delay care.  [facility]  If you or a family member do not have a primary care provider, use the link below to schedule a visit and establish care. When you choose a [facility] primary care physician or advanced practice provider, you gain a long-term partner in health. Find a Primary Care Provider  Learn more about [facility] in-office and virtual care options: [facility] - Get Care Now

## 2022-07-02 NOTE — Progress Notes (Signed)
Virtual Visit Consent   Pamela Kim, you are scheduled for a virtual visit with a [facility] provider today. Just as with appointments in the office, your consent must be obtained to participate. Your consent will be active for this visit and any virtual visit you may have with one of our providers in the next 365 days. If you have a MyChart account, a copy of this consent can be sent to you electronically.  As this is a virtual visit, video technology does not allow for your provider to perform a traditional examination. This may limit your provider's ability to fully assess your condition. If your provider identifies any concerns that need to be evaluated in person or the need to arrange testing (such as labs, EKG, etc.), we will make arrangements to do so. Although advances in technology are sophisticated, we cannot ensure that it will always work on either your end or our end. If the connection with a video visit is poor, the visit may have to be switched to a telephone visit. With either a video or telephone visit, we are not always able to ensure that we have a secure connection.  By engaging in this virtual visit, you consent to the provision of healthcare and authorize for your insurance to be billed (if applicable) for the services provided during this visit. Depending on your insurance coverage, you may receive a charge related to this service.  I need to obtain your verbal consent now. Are you willing to proceed with your visit today? [name] has provided verbal consent on 07/02/2022 for a virtual visit (video or telephone). Margaretann Loveless, PA-C  Date: 07/02/2022 9:28 AM  Virtual Visit via Video Note   I, Margaretann Loveless, connected with  Pamela Kim  ([id number], July 08, 1945) on 07/02/22 at  9:15 AM EDT by a video-enabled telemedicine application and verified that I am speaking with the correct person using two identifiers.  Location: Patient: Virtual Visit  Location Patient: Home Provider: Virtual Visit Location Provider: Home Office   I discussed the limitations of evaluation and management by telemedicine and the availability of in person appointments. The patient expressed understanding and agreed to proceed.    History of Present Illness: [name] is a 77 y.o. who identifies as a female who was assigned female at birth, and is being seen today for Covid 32.  HPI: URI  This is a new problem. Episode onset: Tested positive for Covid 19 today; Symptoms started last night, worsened. The problem has been gradually worsening. Associated symptoms include congestion, coughing, headaches, rhinorrhea and sinus pain. Pertinent negatives include no diarrhea, ear pain, nausea, plugged ear sensation, sore throat or vomiting. Associated symptoms comments: Lightheadedness, fatigue.     Problems:  Patient Active Problem List   Diagnosis Date Noted   Fracture of 5th metatarsal 10/20/2011    Allergies:  Allergies  Allergen Reactions   Aspirin Hives   Keflex [Cephalexin] Hives   Neo-Synephrine [Phenylephrine Hcl (Pressors)] Other (See Comments)    Migraine headaches    Penicillins Hives   Tape     Scotch tape   Wal-Profen D Cold & Sinus [Pseudoephedrine-Ibuprofen] Other (See Comments)   Medications:  Current Outpatient Medications:    benzonatate (TESSALON) 100 MG capsule, Take 1 capsule (100 mg total) by mouth 3 (three) times daily as needed., Disp: 30 capsule, Rfl: 0   nirmatrelvir/ritonavir, renal dosing, (PAXLOVID) 10 x 150 MG & 10 x 100MG  TABS, Take 2 tablets by  mouth 2 (two) times daily for 5 days. (Take nirmatrelvir 150 mg one tablet twice daily for 5 days and ritonavir 100 mg one tablet twice daily for 5 days) Patient GFR is 53, Disp: 20 tablet, Rfl: 0   predniSONE (DELTASONE) 20 MG tablet, Take 2 tablets (40 mg total) by mouth daily with breakfast., Disp: 10 tablet, Rfl: 0   b complex vitamins capsule, Take 1 capsule by mouth  daily., Disp: , Rfl:    Beta Carotene (VITAMIN A) 25000 UNIT capsule, Take 25,000 Units by mouth daily., Disp: , Rfl:    Cholecalciferol (VITAMIN D3) 1.25 MG (50000 UT) CAPS, Take by mouth., Disp: , Rfl:    co-enzyme Q-10 30 MG capsule, Take 30 mg by mouth 3 (three) times daily., Disp: , Rfl:    losartan-hydrochlorothiazide (HYZAAR) 50-12.5 MG tablet, TAKE 1 TABLET BY MOUTH EVERY DAY, Disp: 90 tablet, Rfl: 2   metoCLOPramide (REGLAN) 10 MG tablet, Take 1 tablet (10 mg total) by mouth every 6 (six) hours as needed for nausea., Disp: 10 tablet, Rfl: 0   Omega-3 1000 MG CAPS, Take by mouth. Advance Omega, Disp: , Rfl:    ondansetron (ZOFRAN-ODT) 4 MG disintegrating tablet, Take 4 mg by mouth every 8 (eight) hours as needed for nausea or vomiting., Disp: , Rfl:    simvastatin (ZOCOR) 10 MG tablet, Take 1 tablet (10 mg total) by mouth at bedtime., Disp: 90 tablet, Rfl: 3   thyroid (ARMOUR THYROID) 60 MG tablet, Take 1 tablet (60 mg total) by mouth daily before breakfast., Disp: 90 tablet, Rfl: 1   VITAMIN K PO, Take 120 mcg by mouth daily., Disp: , Rfl:   Observations/Objective: Patient is well-developed, well-nourished in no acute distress.  Resting comfortably at home.  Head is normocephalic, atraumatic.  No labored breathing.  Speech is clear and coherent with logical content.  Patient is alert and oriented at baseline.    Assessment and Plan: 1. COVID-19 - nirmatrelvir/ritonavir, renal dosing, (PAXLOVID) 10 x 150 MG & 10 x 100MG  TABS; Take 2 tablets by mouth 2 (two) times daily for 5 days. (Take nirmatrelvir 150 mg one tablet twice daily for 5 days and ritonavir 100 mg one tablet twice daily for 5 days) Patient GFR is 53  Dispense: 20 tablet; Refill: 0 - predniSONE (DELTASONE) 20 MG tablet; Take 2 tablets (40 mg total) by mouth daily with breakfast.  Dispense: 10 tablet; Refill: 0 - MyChart COVID-19 home monitoring program; Future - benzonatate (TESSALON) 100 MG capsule; Take 1 capsule (100  mg total) by mouth 3 (three) times daily as needed.  Dispense: 30 capsule; Refill: 0  - Continue OTC symptomatic management of choice - Will send OTC vitamins and supplement information through AVS - Paxlovid renal dose prescribed - Prednisone and Tessalon perles for cough - Patient enrolled in MyChart symptom monitoring - Push fluids - Rest as needed - Discussed return precautions and when to seek in-person evaluation, sent via AVS as well   Follow Up Instructions: I discussed the assessment and treatment plan with the patient. The patient was provided an opportunity to ask questions and all were answered. The patient agreed with the plan and demonstrated an understanding of the instructions.  A copy of instructions were sent to the patient via MyChart unless otherwise noted below.     The patient was advised to call back or seek an in-person evaluation if the symptoms worsen or if the condition fails to improve as anticipated.  Time:  I spent 15  minutes with the patient via telehealth technology discussing the above problems/concerns.    Margaretann Loveless, PA-C

## 2022-07-07 ENCOUNTER — Telehealth: Payer: Self-pay

## 2022-07-07 NOTE — Telephone Encounter (Signed)
Called, spoke with patient in regards to COVID survey for symptoms of weakness. Patient states that she is feeling more tired but is getting better. She states that she does not feel faint. Advise patient to seek help at the ED if symptoms get worse, patient verbalized understanding.

## 2022-07-29 ENCOUNTER — Ambulatory Visit (INDEPENDENT_AMBULATORY_CARE_PROVIDER_SITE_OTHER): Payer: Medicare HMO | Admitting: Adult Health

## 2022-07-29 ENCOUNTER — Encounter: Payer: Self-pay | Admitting: Adult Health

## 2022-07-29 VITALS — BP 130/88 | HR 77 | Temp 97.9°F | Ht 62.0 in | Wt 133.0 lb

## 2022-07-29 DIAGNOSIS — E039 Hypothyroidism, unspecified: Secondary | ICD-10-CM | POA: Diagnosis not present

## 2022-07-29 DIAGNOSIS — I10 Essential (primary) hypertension: Secondary | ICD-10-CM

## 2022-07-29 DIAGNOSIS — E785 Hyperlipidemia, unspecified: Secondary | ICD-10-CM | POA: Diagnosis not present

## 2022-07-29 DIAGNOSIS — M810 Age-related osteoporosis without current pathological fracture: Secondary | ICD-10-CM | POA: Diagnosis not present

## 2022-07-29 DIAGNOSIS — Z0000 Encounter for general adult medical examination without abnormal findings: Secondary | ICD-10-CM | POA: Diagnosis not present

## 2022-07-29 NOTE — Progress Notes (Signed)
Subjective:    Patient ID: Pamela Kim, female    DOB: [birth year], 77 y.o.   MRN: [id number]  HPI Patient presents for yearly preventative medicine examination. She is a 77 year old female who  has a past medical history of Hypertension and Hypothyroidism.  Essential hypertension-prescribed Hyzaar 50-12.5 mg daily. She does not check her blood pressure at home. Denies symptoms of high blood pressure.  BP Readings from Last 3 Encounters:  07/29/22 130/88  04/27/22 (!) 140/74  12/30/21 120/80   Hypothyroidism-currently prescribed Armour Thyroid 60 mg every morning.  She has tried Synthroid in the past on multiple occasions and reports this Synthroid caused her to have stiffness in her muscles and fatigue. Lab Results  Component Value Date   TSH 3.53 04/27/2022   Hyperlipidemia - managed with Simvastatin 10 mg daily. She denies myalgia or fatigue  Lab Results  Component Value Date   CHOL 221 (H) 08/11/2021   HDL 68.50 08/11/2021   LDLCALC 133 (H) 08/11/2021   TRIG 99.0 08/11/2021   CHOLHDL 3 08/11/2021   Osteoporosis -bone density screen in July 2023 showed T-score of -2.7.  At this time she refused treatment.  She does take vitamin D and calcium supplements.  All immunizations and health maintenance protocols were reviewed with the patient and needed orders were placed.  Appropriate screening laboratory values were ordered for the patient including screening of hyperlipidemia, renal function and hepatic function.  Medication reconciliation,  past medical history, social history, problem list and allergies were reviewed in detail with the patient  Goals were established with regard to weight loss, exercise, and  diet in compliance with medications Wt Readings from Last 3 Encounters:  07/29/22 133 lb (60.3 kg)  04/27/22 130 lb (59 kg)  12/30/21 130 lb (59 kg)   Review of Systems  Constitutional: Negative.   HENT: Negative.    Eyes: Negative.   Respiratory: Negative.     Cardiovascular: Negative.   Gastrointestinal: Negative.   Endocrine: Negative.   Genitourinary: Negative.   Musculoskeletal: Negative.   Skin: Negative.   Allergic/Immunologic: Negative.   Neurological: Negative.   Hematological: Negative.   Psychiatric/Behavioral: Negative.     Past Medical History:  Diagnosis Date   Hypertension    Hypothyroidism     Social History   Socioeconomic History   Marital status: Married    Spouse name: Not on file   Number of children: Not on file   Years of education: Not on file   Highest education level: Bachelor's degree (e.g., BA, AB, BS)  Occupational History   Not on file  Tobacco Use   Smoking status: Never   Smokeless tobacco: Never  Vaping Use   Vaping Use: Never used  Substance and Sexual Activity   Alcohol use: Yes    Comment: Twice yearly   Drug use: Not on file   Sexual activity: Not on file  Other Topics Concern   Not on file  Social History Narrative   Not on file   Social Determinants of Health   Financial Resource Strain: Low Risk  (09/21/2021)   Overall Financial Resource Strain (CARDIA)    Difficulty of Paying Living Expenses: Not hard at all  Food Insecurity: No Food Insecurity (09/21/2021)   Hunger Vital Sign    Worried About Running Out of Food in the Last Year: Never true    Ran Out of Food in the Last Year: Never true  Transportation Needs: No Transportation Needs (09/21/2021)  PRAPARE - Administrator, Civil Service (Medical): No    Lack of Transportation (Non-Medical): No  Physical Activity: Sufficiently Active (09/21/2021)   Exercise Vital Sign    Days of Exercise per Week: 4 days    Minutes of Exercise per Session: 60 min  Stress: No Stress Concern Present (09/21/2021)   [employer] of Occupational Health - Occupational Stress Questionnaire    Feeling of Stress : Only a little  Social Connections: Moderately Integrated (09/21/2021)   Social Connection and Isolation Panel [NHANES]     Frequency of Communication with Friends and Family: More than three times a week    Frequency of Social Gatherings with Friends and Family: More than three times a week    Attends Religious Services: More than 4 times per year    Active Member of [employer] or Organizations: No    Attends Engineer, structural: Not on file    Marital Status: Married  Catering manager Violence: Not on file    Past Surgical History:  Procedure Laterality Date   CATARACT EXTRACTION, BILATERAL  2016   HAND SURGERY      Family History  Problem Relation Age of Onset   Asthma Mother    Mental illness Mother    Luiz Blare' disease Mother    COPD Mother    Heart murmur Father    Learning disabilities Sister    Hypothyroidism Sister    Hypothyroidism Sister     Allergies  Allergen Reactions   Aspirin Hives   Keflex [Cephalexin] Hives   Neo-Synephrine [Phenylephrine Hcl (Pressors)] Other (See Comments)    Migraine headaches    Penicillins Hives   Tape     Scotch tape   Wal-Profen D Cold & Sinus [Pseudoephedrine-Ibuprofen] Other (See Comments)    Current Outpatient Medications on File Prior to Visit  Medication Sig Dispense Refill   b complex vitamins capsule Take 1 capsule by mouth daily.     Beta Carotene (VITAMIN A) 25000 UNIT capsule Take 25,000 Units by mouth daily.     Cholecalciferol (VITAMIN D3) 1.25 MG (50000 UT) CAPS Take by mouth.     co-enzyme Q-10 30 MG capsule Take 30 mg by mouth 3 (three) times daily.     losartan-hydrochlorothiazide (HYZAAR) 50-12.5 MG tablet TAKE 1 TABLET BY MOUTH EVERY DAY 90 tablet 2   metoCLOPramide (REGLAN) 10 MG tablet Take 1 tablet (10 mg total) by mouth every 6 (six) hours as needed for nausea. 10 tablet 0   Omega-3 1000 MG CAPS Take by mouth. Advance Omega     ondansetron (ZOFRAN-ODT) 4 MG disintegrating tablet Take 4 mg by mouth every 8 (eight) hours as needed for nausea or vomiting.     simvastatin (ZOCOR) 10 MG tablet Take 1 tablet (10 mg total) by  mouth at bedtime. 90 tablet 3   thyroid (ARMOUR THYROID) 60 MG tablet Take 1 tablet (60 mg total) by mouth daily before breakfast. 90 tablet 1   Vitamin C, Calcium Ascorbate, SOLR Take by mouth.     VITAMIN K PO Take 120 mcg by mouth daily.     No current facility-administered medications on file prior to visit.    BP 130/88   Pulse 77   Temp 97.9 F (36.6 C) (Oral)   Ht 5\' 2"  (1.575 m)   Wt 133 lb (60.3 kg)   SpO2 96%   BMI 24.33 kg/m       Objective:   Physical Exam Vitals and nursing note  reviewed.  Constitutional:      General: She is not in acute distress.    Appearance: Normal appearance. She is not ill-appearing.  HENT:     Head: Normocephalic and atraumatic.     Right Ear: Tympanic membrane, ear canal and external ear normal. There is no impacted cerumen.     Left Ear: Tympanic membrane, ear canal and external ear normal. There is no impacted cerumen.     Nose: Nose normal. No congestion or rhinorrhea.     Mouth/Throat:     Mouth: Mucous membranes are moist.     Pharynx: Oropharynx is clear.  Eyes:     Extraocular Movements: Extraocular movements intact.     Conjunctiva/sclera: Conjunctivae normal.     Pupils: Pupils are equal, round, and reactive to light.  Neck:     Vascular: No carotid bruit.  Cardiovascular:     Rate and Rhythm: Normal rate and regular rhythm.     Pulses: Normal pulses.     Heart sounds: No murmur heard.    No friction rub. No gallop.  Pulmonary:     Effort: Pulmonary effort is normal.     Breath sounds: Normal breath sounds.  Abdominal:     General: Abdomen is flat. Bowel sounds are normal. There is no distension.     Palpations: Abdomen is soft. There is no mass.     Tenderness: There is no abdominal tenderness. There is no guarding or rebound.     Hernia: No hernia is present.  Musculoskeletal:        General: Normal range of motion.     Cervical back: Normal range of motion and neck supple.  Lymphadenopathy:     Cervical: No  cervical adenopathy.  Skin:    General: Skin is warm and dry.     Capillary Refill: Capillary refill takes less than 2 seconds.  Neurological:     General: No focal deficit present.     Mental Status: She is alert and oriented to person, place, and time.  Psychiatric:        Mood and Affect: Mood normal.        Behavior: Behavior normal.        Thought Content: Thought content normal.        Judgment: Judgment normal.       Assessment & Plan:  1. Routine general medical examination at a health care facility Today patient counseled on age appropriate routine health concerns for screening and prevention, each reviewed and up to date or declined. Immunizations reviewed and up to date or declined. Labs ordered and reviewed. Risk factors for depression reviewed and negative. Hearing function and visual acuity are intact. ADLs screened and addressed as needed. Functional ability and level of safety reviewed and appropriate. Education, counseling and referrals performed based on assessed risks today. Patient provided with a copy of personalized plan for preventive services. - Follow up in one month  - Advised shingles vaccination at pharmacy   2. Acquired hypothyroidism - Consider dose change of Armour  - CBC with Differential/Platelet; Future - Comprehensive metabolic panel; Future - Lipid panel; Future - TSH; Future - T3, Free; Future - T4, Free; Future  3. Hyperlipidemia, unspecified hyperlipidemia type - Consider dose change of statin  - CBC with Differential/Platelet; Future - Comprehensive metabolic panel; Future - Lipid panel; Future - TSH; Future  4. Essential hypertension - Controlled. No change in medication  - CBC with Differential/Platelet; Future - Comprehensive metabolic panel; Future -  Lipid panel; Future - TSH; Future  5. Age-related osteoporosis without current pathological fracture - She is interested in Prolia. We will find out if it is covered.  - VITAMIN D  25 Hydroxy (Vit-D Deficiency, Fractures); Future  Shirline Frees, NP

## 2022-07-30 ENCOUNTER — Telehealth: Payer: Self-pay

## 2022-07-30 ENCOUNTER — Telehealth: Payer: Self-pay | Admitting: Adult Health

## 2022-07-30 LAB — CBC WITH DIFFERENTIAL/PLATELET
Basophils Absolute: 0.1 10*3/uL (ref 0.0–0.1)
Basophils Relative: 0.8 % (ref 0.0–3.0)
Eosinophils Absolute: 0.2 10*3/uL (ref 0.0–0.7)
Eosinophils Relative: 2.7 % (ref 0.0–5.0)
HCT: 34.2 % — ABNORMAL LOW (ref 36.0–46.0)
Hemoglobin: 11.2 g/dL — ABNORMAL LOW (ref 12.0–15.0)
Lymphocytes Relative: 17.2 % (ref 12.0–46.0)
Lymphs Abs: 1.3 10*3/uL (ref 0.7–4.0)
MCHC: 32.7 g/dL (ref 30.0–36.0)
MCV: 92.9 fl (ref 78.0–100.0)
Monocytes Absolute: 0.7 10*3/uL (ref 0.1–1.0)
Monocytes Relative: 8.5 % (ref 3.0–12.0)
Neutro Abs: 5.4 10*3/uL (ref 1.4–7.7)
Neutrophils Relative %: 70.8 % (ref 43.0–77.0)
Platelets: 267 10*3/uL (ref 150.0–400.0)
RBC: 3.68 Mil/uL — ABNORMAL LOW (ref 3.87–5.11)
RDW: 13.6 % (ref 11.5–15.5)
WBC: 7.7 10*3/uL (ref 4.0–10.5)

## 2022-07-30 LAB — T4, FREE: Free T4: 0.45 ng/dL — ABNORMAL LOW (ref 0.60–1.60)

## 2022-07-30 LAB — TSH: TSH: 6.76 u[IU]/mL — ABNORMAL HIGH (ref 0.35–5.50)

## 2022-07-30 LAB — VITAMIN D 25 HYDROXY (VIT D DEFICIENCY, FRACTURES): VITD: 31.43 ng/mL (ref 30.00–100.00)

## 2022-07-30 LAB — T3, FREE: T3, Free: 2.6 pg/mL (ref 2.3–4.2)

## 2022-07-30 NOTE — Telephone Encounter (Signed)
Called patient to come here or [facility] to have labs redrawn.

## 2022-07-30 NOTE — Addendum Note (Signed)
Addended by: Marian Sorrow D on: 07/30/2022 01:02 PM   Modules accepted: Orders

## 2022-07-31 ENCOUNTER — Encounter: Payer: Self-pay | Admitting: Adult Health

## 2022-08-04 ENCOUNTER — Other Ambulatory Visit (INDEPENDENT_AMBULATORY_CARE_PROVIDER_SITE_OTHER): Payer: Medicare HMO

## 2022-08-04 DIAGNOSIS — E785 Hyperlipidemia, unspecified: Secondary | ICD-10-CM

## 2022-08-04 DIAGNOSIS — M810 Age-related osteoporosis without current pathological fracture: Secondary | ICD-10-CM

## 2022-08-04 DIAGNOSIS — I10 Essential (primary) hypertension: Secondary | ICD-10-CM

## 2022-08-04 DIAGNOSIS — E039 Hypothyroidism, unspecified: Secondary | ICD-10-CM

## 2022-08-04 LAB — LIPID PANEL
Cholesterol: 193 mg/dL (ref 0–200)
HDL: 77.7 mg/dL (ref >39.00)
LDL Cholesterol: 105 mg/dL — ABNORMAL HIGH (ref 0–99)
NonHDL: 115.27
Total CHOL/HDL Ratio: 2
Triglycerides: 49 mg/dL (ref 0.0–149.0)
VLDL: 9.8 mg/dL (ref 0.0–40.0)

## 2022-08-04 LAB — CBC WITH DIFFERENTIAL/PLATELET
Basophils Absolute: 0 10*3/uL (ref 0.0–0.1)
Basophils Relative: 0.5 % (ref 0.0–3.0)
Eosinophils Absolute: 0.3 10*3/uL (ref 0.0–0.7)
Eosinophils Relative: 3.8 % (ref 0.0–5.0)
HCT: 34.1 % — ABNORMAL LOW (ref 36.0–46.0)
Hemoglobin: 11.3 g/dL — ABNORMAL LOW (ref 12.0–15.0)
Lymphocytes Relative: 21.6 % (ref 12.0–46.0)
Lymphs Abs: 1.5 10*3/uL (ref 0.7–4.0)
MCHC: 33.2 g/dL (ref 30.0–36.0)
MCV: 93.1 fl (ref 78.0–100.0)
Monocytes Absolute: 0.7 10*3/uL (ref 0.1–1.0)
Monocytes Relative: 9.5 % (ref 3.0–12.0)
Neutro Abs: 4.6 10*3/uL (ref 1.4–7.7)
Neutrophils Relative %: 64.6 % (ref 43.0–77.0)
Platelets: 261 10*3/uL (ref 150.0–400.0)
RBC: 3.66 Mil/uL — ABNORMAL LOW (ref 3.87–5.11)
RDW: 13.6 % (ref 11.5–15.5)
WBC: 7.1 10*3/uL (ref 4.0–10.5)

## 2022-08-04 LAB — COMPREHENSIVE METABOLIC PANEL
ALT: 13 U/L (ref 0–35)
AST: 18 U/L (ref 0–37)
Albumin: 4.2 g/dL (ref 3.5–5.2)
Alkaline Phosphatase: 67 U/L (ref 39–117)
BUN: 25 mg/dL — ABNORMAL HIGH (ref 6–23)
CO2: 31 mEq/L (ref 19–32)
Calcium: 9.8 mg/dL (ref 8.4–10.5)
Chloride: 99 mEq/L (ref 96–112)
Creatinine, Ser: 1.15 mg/dL (ref 0.40–1.20)
GFR: 46.73 mL/min — ABNORMAL LOW (ref >60.00)
Glucose, Bld: 90 mg/dL (ref 70–99)
Potassium: 4.2 mEq/L (ref 3.5–5.1)
Sodium: 134 mEq/L — ABNORMAL LOW (ref 135–145)
Total Bilirubin: 0.3 mg/dL (ref 0.2–1.2)
Total Protein: 7.3 g/dL (ref 6.0–8.3)

## 2022-08-04 LAB — T4, FREE: Free T4: 0.5 ng/dL — ABNORMAL LOW (ref 0.60–1.60)

## 2022-08-04 LAB — VITAMIN D 25 HYDROXY (VIT D DEFICIENCY, FRACTURES): VITD: 27.23 ng/mL — ABNORMAL LOW (ref 30.00–100.00)

## 2022-08-04 LAB — T3, FREE: T3, Free: 3.3 pg/mL (ref 2.3–4.2)

## 2022-08-04 LAB — TSH: TSH: 4.14 u[IU]/mL (ref 0.35–5.50)

## 2022-08-09 ENCOUNTER — Encounter: Payer: Self-pay | Admitting: Adult Health

## 2022-08-10 NOTE — Telephone Encounter (Signed)
Noted  

## 2022-08-10 NOTE — Telephone Encounter (Signed)
Please advise 

## 2022-08-10 NOTE — Telephone Encounter (Signed)
Can you assist with these results?

## 2022-08-11 ENCOUNTER — Other Ambulatory Visit: Payer: Self-pay | Admitting: Adult Health

## 2022-08-11 ENCOUNTER — Other Ambulatory Visit: Payer: Self-pay

## 2022-08-11 MED ORDER — SIMVASTATIN 20 MG PO TABS: 20.0000 mg | ORAL_TABLET | Freq: Every day | ORAL | 3 refills | Status: DC

## 2022-08-11 MED ORDER — THYROID 60 MG PO TABS: 60.0000 mg | ORAL_TABLET | Freq: Every day | ORAL | 1 refills | Status: DC

## 2022-08-11 NOTE — Telephone Encounter (Signed)
FYI

## 2022-08-12 ENCOUNTER — Other Ambulatory Visit: Payer: Self-pay | Admitting: Adult Health

## 2022-08-12 MED ORDER — THYROID 60 MG PO TABS: 60.0000 mg | ORAL_TABLET | Freq: Every day | ORAL | 3 refills | Status: DC

## 2022-08-12 NOTE — Telephone Encounter (Signed)
Pt called to say pharmacy does not have this in stock at the moment and is offering her an alternative.  Pt asked for a call back to discuss. Marland Kitchen

## 2022-08-12 NOTE — Telephone Encounter (Signed)
Pt is calling back disregard alternative [facility] does have thyroid (ARMOUR THYROID) 60 MG tablet  in stock please resend

## 2022-08-23 ENCOUNTER — Other Ambulatory Visit: Payer: Self-pay | Admitting: Adult Health

## 2022-08-24 NOTE — Telephone Encounter (Signed)
he original prescription was discontinued on 08/11/2022 by Sherrin Daisy, CMA for the following reason: Change in therapy. Renewing this prescription may not be appropriate.

## 2022-09-08 ENCOUNTER — Ambulatory Visit: Payer: Medicare HMO | Admitting: Adult Health

## 2022-09-15 ENCOUNTER — Ambulatory Visit (INDEPENDENT_AMBULATORY_CARE_PROVIDER_SITE_OTHER): Payer: Medicare HMO

## 2022-09-15 ENCOUNTER — Encounter: Payer: Self-pay | Admitting: Adult Health

## 2022-09-15 ENCOUNTER — Ambulatory Visit (INDEPENDENT_AMBULATORY_CARE_PROVIDER_SITE_OTHER): Payer: Medicare HMO | Admitting: Adult Health

## 2022-09-15 VITALS — BP 132/80 | HR 80 | Temp 98.1°F | Ht 62.0 in | Wt 136.0 lb

## 2022-09-15 DIAGNOSIS — M810 Age-related osteoporosis without current pathological fracture: Secondary | ICD-10-CM

## 2022-09-15 DIAGNOSIS — G8929 Other chronic pain: Secondary | ICD-10-CM

## 2022-09-15 DIAGNOSIS — R0781 Pleurodynia: Secondary | ICD-10-CM

## 2022-09-15 DIAGNOSIS — M25512 Pain in left shoulder: Secondary | ICD-10-CM | POA: Diagnosis not present

## 2022-09-15 DIAGNOSIS — M25521 Pain in right elbow: Secondary | ICD-10-CM

## 2022-09-15 DIAGNOSIS — M19012 Primary osteoarthritis, left shoulder: Secondary | ICD-10-CM | POA: Diagnosis not present

## 2022-09-15 MED ORDER — ALENDRONATE SODIUM 70 MG PO TABS
70.0000 mg | ORAL_TABLET | ORAL | 3 refills | Status: AC
Start: 2022-09-15 — End: 2022-12-14

## 2022-09-15 NOTE — Progress Notes (Signed)
Subjective:    Patient ID: Pamela Kim, female    DOB: May 30, 1945, 77 y.o.   MRN: [id number]  HPI 77 year old female who  has a past medical history of Hypertension and Hypothyroidism.  She presents to the office today for multiple orthopedic complaints   Left Shoulder Pain-this has been an ongoing issue.  She finds that when she does physical activity around the house that for the next 2 days her left shoulder will painful.  She reports that when pain is present she has loss of range of motion he cannot bend to reach behind her back and put on her bra or put on a shirt.  His range of motion eventually comes back once the pain has resolved. Tylenol helps with the pain   Right Elbow Pain -again has been an ongoing issue for quite some time.  Has pain in her right elbow with physical activity pain causes decreased range of motion especially with bending, pinching, and grabbing motions.  She does have loss of grip strength in her right hand when she has this discomfort in her elbow.  Wearing an elbow brace significantly improved discomfort. Tylenol helps with the pain   Lower Rib Pain -9 months ago she was seen in the emergency room with nausea, vomiting and diarrhea and at this time CT scan of abdomen and pelvis did not show any acute abnormality.  She reports that since that time she has had intermittent pain across the lower portion of her ribs bilaterally.  This can be exacerbated by physical activity.    Furthermore, although her insurance covers Prolia, 2 of her sisters have had reactions to Prolia and she would like to go on Fosamax for osteoporosis   Review of Systems See HPI   Past Medical History:  Diagnosis Date   Hypertension    Hypothyroidism     Social History   Socioeconomic History   Marital status: Married    Spouse name: Not on file   Number of children: Not on file   Years of education: Not on file   Highest education level: Bachelor's degree (e.g., BA, AB,  BS)  Occupational History   Not on file  Tobacco Use   Smoking status: Never   Smokeless tobacco: Never  Vaping Use   Vaping status: Never Used  Substance and Sexual Activity   Alcohol use: Yes    Comment: Twice yearly   Drug use: Not on file   Sexual activity: Not on file  Other Topics Concern   Not on file  Social History Narrative   Not on file   Social Determinants of Health   Financial Resource Strain: Low Risk  (09/21/2021)   Overall Financial Resource Strain (CARDIA)    Difficulty of Paying Living Expenses: Not hard at all  Food Insecurity: No Food Insecurity (09/21/2021)   Hunger Vital Sign    Worried About Running Out of Food in the Last Year: Never true    Ran Out of Food in the Last Year: Never true  Transportation Needs: No Transportation Needs (09/21/2021)   PRAPARE - Administrator, Civil Service (Medical): No    Lack of Transportation (Non-Medical): No  Physical Activity: Sufficiently Active (09/21/2021)   Exercise Vital Sign    Days of Exercise per Week: 4 days    Minutes of Exercise per Session: 60 min  Stress: No Stress Concern Present (09/21/2021)   [employer] of Occupational Health - Occupational Stress Questionnaire  Feeling of Stress : Only a little  Social Connections: Moderately Integrated (09/21/2021)   Social Connection and Isolation Panel [NHANES]    Frequency of Communication with Friends and Family: More than three times a week    Frequency of Social Gatherings with Friends and Family: More than three times a week    Attends Religious Services: More than 4 times per year    Active Member of [employer] or Organizations: No    Attends Engineer, structural: Not on file    Marital Status: Married  Catering manager Violence: Not on file    Past Surgical History:  Procedure Laterality Date   CATARACT EXTRACTION, BILATERAL  2016   HAND SURGERY      Family History  Problem Relation Age of Onset   Asthma Mother    Mental  illness Mother    Luiz Blare' disease Mother    COPD Mother    Heart murmur Father    Learning disabilities Sister    Hypothyroidism Sister    Hypothyroidism Sister     Allergies  Allergen Reactions   Aspirin Hives   Keflex [Cephalexin] Hives   Neo-Synephrine [Phenylephrine Hcl (Pressors)] Other (See Comments)    Migraine headaches    Penicillins Hives   Tape     Scotch tape   Wal-Profen D Cold & Sinus [Pseudoephedrine-Ibuprofen] Other (See Comments)    Current Outpatient Medications on File Prior to Visit  Medication Sig Dispense Refill   b complex vitamins capsule Take 1 capsule by mouth daily.     Beta Carotene (VITAMIN A) 25000 UNIT capsule Take 25,000 Units by mouth daily.     Cholecalciferol (VITAMIN D3) 1.25 MG (50000 UT) CAPS Take by mouth.     co-enzyme Q-10 30 MG capsule Take 30 mg by mouth 3 (three) times daily.     losartan-hydrochlorothiazide (HYZAAR) 50-12.5 MG tablet TAKE 1 TABLET BY MOUTH EVERY DAY 90 tablet 2   metoCLOPramide (REGLAN) 10 MG tablet Take 1 tablet (10 mg total) by mouth every 6 (six) hours as needed for nausea. 10 tablet 0   Omega-3 1000 MG CAPS Take by mouth. Advance Omega     ondansetron (ZOFRAN-ODT) 4 MG disintegrating tablet Take 4 mg by mouth every 8 (eight) hours as needed for nausea or vomiting.     simvastatin (ZOCOR) 20 MG tablet Take 1 tablet (20 mg total) by mouth at bedtime. 90 tablet 3   thyroid (ARMOUR THYROID) 60 MG tablet Take 1 tablet (60 mg total) by mouth daily before breakfast. 90 tablet 3   Vitamin C, Calcium Ascorbate, SOLR Take by mouth.     VITAMIN K PO Take 120 mcg by mouth daily.     No current facility-administered medications on file prior to visit.    BP 132/80   Pulse 80   Temp 98.1 F (36.7 C) (Oral)   Ht 5\' 2"  (1.575 m)   Wt 136 lb (61.7 kg)   SpO2 96%   BMI 24.87 kg/m       Objective:   Physical Exam Vitals and nursing note reviewed.  Constitutional:      Appearance: Normal appearance.   Cardiovascular:     Rate and Rhythm: Normal rate and regular rhythm.     Pulses: Normal pulses.     Heart sounds: Normal heart sounds.  Musculoskeletal:        General: Normal range of motion.     Left shoulder: Bony tenderness present. No tenderness or crepitus. Normal range of  motion. Normal strength.     Right elbow: No swelling or deformity. Normal range of motion.     Right hand: Normal strength.     Left hand: Normal strength.  Skin:    General: Skin is warm and dry.  Neurological:     General: No focal deficit present.     Mental Status: She is alert and oriented to person, place, and time.  Psychiatric:        Mood and Affect: Mood normal.        Behavior: Behavior normal.        Thought Content: Thought content normal.        Judgment: Judgment normal.       Assessment & Plan:  1. Chronic left shoulder pain -Suspect left shoulder pain and right elbow pain are more related to osteoarthritis.  We will get x-rays today to rule out causes.  Can consider injection into the left shoulder.  We can also refer to orthopedics or physical therapy depending on x-ray results - DG Shoulder Left; Future  2. Right elbow pain  - DG Elbow 2 Views Right; Future  3. Rib pain -Unknown cause at this time.  No abnormality seen on CT in November 2023.  Will do x-ray today in the office - DG Chest 2 View; Future  4. Age-related osteoporosis without current pathological fracture  - alendronate (FOSAMAX) 70 MG tablet; Take 1 tablet (70 mg total) by mouth every 7 (seven) days. Take with a full glass of water on an empty stomach.  Dispense: 12 tablet; Refill: 3  Shirline Frees, NP  Time spent with patient today was 38 minutes which consisted of chart review, discussing multiple orthopedics issues and osteoporosis work up, treatment answering questions and documentation.

## 2022-09-22 DIAGNOSIS — H31003 Unspecified chorioretinal scars, bilateral: Secondary | ICD-10-CM | POA: Diagnosis not present

## 2022-09-22 DIAGNOSIS — H52203 Unspecified astigmatism, bilateral: Secondary | ICD-10-CM | POA: Diagnosis not present

## 2022-09-29 DIAGNOSIS — Z0100 Encounter for examination of eyes and vision without abnormal findings: Secondary | ICD-10-CM | POA: Diagnosis not present

## 2022-09-30 ENCOUNTER — Encounter: Payer: Self-pay | Admitting: Adult Health

## 2022-09-30 ENCOUNTER — Ambulatory Visit (INDEPENDENT_AMBULATORY_CARE_PROVIDER_SITE_OTHER): Payer: Medicare HMO | Admitting: Adult Health

## 2022-09-30 VITALS — BP 132/70 | HR 70 | Temp 97.7°F | Ht 62.0 in | Wt 132.0 lb

## 2022-09-30 DIAGNOSIS — R4189 Other symptoms and signs involving cognitive functions and awareness: Secondary | ICD-10-CM | POA: Diagnosis not present

## 2022-09-30 DIAGNOSIS — M2550 Pain in unspecified joint: Secondary | ICD-10-CM

## 2022-09-30 DIAGNOSIS — G8929 Other chronic pain: Secondary | ICD-10-CM | POA: Diagnosis not present

## 2022-09-30 NOTE — Progress Notes (Signed)
Subjective:    Patient ID: Pamela Kim, female    DOB: April 06, 1945, 77 y.o.   MRN: [id number]  HPI 77 year old female who  has a past medical history of Hypertension and Hypothyroidism.  She has known arthritic pain in multiple joints especially her left shoulder.  Pain seems to be worse after physical activity that she has also noticed that the pain may have increased when she increased her simvastatin.  She also reports feeling "foggy headed".  She is wondering if she can try coming off her statin for short period of time to see if this helps with her symptoms   Review of Systems See HPI   Past Medical History:  Diagnosis Date   Hypertension    Hypothyroidism     Social History   Socioeconomic History   Marital status: Married    Spouse name: Not on file   Number of children: Not on file   Years of education: Not on file   Highest education level: Bachelor's degree (e.g., BA, AB, BS)  Occupational History   Not on file  Tobacco Use   Smoking status: Never   Smokeless tobacco: Never  Vaping Use   Vaping status: Never Used  Substance and Sexual Activity   Alcohol use: Yes    Comment: Twice yearly   Drug use: Not on file   Sexual activity: Not on file  Other Topics Concern   Not on file  Social History Narrative   Not on file   Social Determinants of Health   Financial Resource Strain: Low Risk  (09/26/2022)   Overall Financial Resource Strain (CARDIA)    Difficulty of Paying Living Expenses: Not hard at all  Food Insecurity: No Food Insecurity (09/26/2022)   Hunger Vital Sign    Worried About Running Out of Food in the Last Year: Never true    Ran Out of Food in the Last Year: Never true  Transportation Needs: No Transportation Needs (09/26/2022)   PRAPARE - Administrator, Civil Service (Medical): No    Lack of Transportation (Non-Medical): No  Physical Activity: Insufficiently Active (09/26/2022)   Exercise Vital Sign    Days of Exercise per  Week: 2 days    Minutes of Exercise per Session: 60 min  Stress: No Stress Concern Present (09/26/2022)   [employer] of Occupational Health - Occupational Stress Questionnaire    Feeling of Stress : Not at all  Social Connections: Unknown (09/26/2022)   Social Connection and Isolation Panel [NHANES]    Frequency of Communication with Friends and Family: More than three times a week    Frequency of Social Gatherings with Friends and Family: More than three times a week    Attends Religious Services: Patient declined    Database administrator or Organizations: Patient declined    Attends Banker Meetings: Not on file    Marital Status: Married  Catering manager Violence: Not on file    Past Surgical History:  Procedure Laterality Date   CATARACT EXTRACTION, BILATERAL  2016   HAND SURGERY      Family History  Problem Relation Age of Onset   Asthma Mother    Mental illness Mother    Luiz Blare' disease Mother    COPD Mother    Heart murmur Father    Learning disabilities Sister    Hypothyroidism Sister    Hypothyroidism Sister     Allergies  Allergen Reactions   Aspirin Hives  Keflex [Cephalexin] Hives   Neo-Synephrine [Phenylephrine Hcl (Pressors)] Other (See Comments)    Migraine headaches    Penicillins Hives   Tape     Scotch tape   Wal-Profen D Cold & Sinus [Pseudoephedrine-Ibuprofen] Other (See Comments)    Current Outpatient Medications on File Prior to Visit  Medication Sig Dispense Refill   alendronate (FOSAMAX) 70 MG tablet Take 1 tablet (70 mg total) by mouth every 7 (seven) days. Take with a full glass of water on an empty stomach. 12 tablet 3   b complex vitamins capsule Take 1 capsule by mouth daily.     Beta Carotene (VITAMIN A) 25000 UNIT capsule Take 25,000 Units by mouth daily.     Cholecalciferol (VITAMIN D3) 1.25 MG (50000 UT) CAPS Take by mouth.     co-enzyme Q-10 30 MG capsule Take 30 mg by mouth 3 (three) times daily.      losartan-hydrochlorothiazide (HYZAAR) 50-12.5 MG tablet TAKE 1 TABLET BY MOUTH EVERY DAY 90 tablet 2   metoCLOPramide (REGLAN) 10 MG tablet Take 1 tablet (10 mg total) by mouth every 6 (six) hours as needed for nausea. 10 tablet 0   Omega-3 1000 MG CAPS Take by mouth. Advance Omega     ondansetron (ZOFRAN-ODT) 4 MG disintegrating tablet Take 4 mg by mouth every 8 (eight) hours as needed for nausea or vomiting.     simvastatin (ZOCOR) 20 MG tablet Take 1 tablet (20 mg total) by mouth at bedtime. 90 tablet 3   thyroid (ARMOUR THYROID) 60 MG tablet Take 1 tablet (60 mg total) by mouth daily before breakfast. 90 tablet 3   Vitamin C, Calcium Ascorbate, SOLR Take by mouth.     VITAMIN K PO Take 120 mcg by mouth daily.     No current facility-administered medications on file prior to visit.    BP 132/70   Pulse 70   Temp 97.7 F (36.5 C) (Oral)   Ht 5\' 2"  (1.575 m)   Wt 132 lb (59.9 kg)   SpO2 98%   BMI 24.14 kg/m       Objective:   Physical Exam Vitals and nursing note reviewed.  Constitutional:      Appearance: Normal appearance.  Cardiovascular:     Rate and Rhythm: Normal rate and regular rhythm.     Pulses: Normal pulses.     Heart sounds: Normal heart sounds.  Pulmonary:     Effort: Pulmonary effort is normal.     Breath sounds: Normal breath sounds.  Musculoskeletal:        General: Normal range of motion.  Skin:    General: Skin is warm and dry.  Neurological:     General: No focal deficit present.     Mental Status: She is alert and oriented to person, place, and time.  Psychiatric:        Mood and Affect: Mood normal.        Behavior: Behavior normal.        Thought Content: Thought content normal.        Judgment: Judgment normal.        Assessment & Plan:  1. Chronic joint pain -I am fine with her coming off her statin for a month or 2 to see if the joint pain and cognitive impairment seems to improve.  If it does improve then we can look at either changing  statin or going to 2-3 times a week.  She will follow-up with me in a month  or two   2. Cognitive impairment

## 2022-10-20 ENCOUNTER — Other Ambulatory Visit: Payer: Self-pay | Admitting: Adult Health

## 2022-11-04 ENCOUNTER — Ambulatory Visit (INDEPENDENT_AMBULATORY_CARE_PROVIDER_SITE_OTHER): Payer: Medicare HMO | Admitting: Adult Health

## 2022-11-04 ENCOUNTER — Encounter: Payer: Self-pay | Admitting: Adult Health

## 2022-11-04 VITALS — BP 120/78 | HR 71 | Temp 97.8°F | Ht 62.0 in | Wt 132.0 lb

## 2022-11-04 DIAGNOSIS — M810 Age-related osteoporosis without current pathological fracture: Secondary | ICD-10-CM | POA: Diagnosis not present

## 2022-11-04 DIAGNOSIS — E785 Hyperlipidemia, unspecified: Secondary | ICD-10-CM | POA: Diagnosis not present

## 2022-11-04 DIAGNOSIS — M25512 Pain in left shoulder: Secondary | ICD-10-CM | POA: Diagnosis not present

## 2022-11-04 DIAGNOSIS — G8929 Other chronic pain: Secondary | ICD-10-CM

## 2022-11-04 NOTE — Progress Notes (Signed)
Subjective:    Patient ID: Pamela Kim, female    DOB: February 18, 1945, 77 y.o.   MRN: [id number]  HPI 77 year old female who  has a past medical history of Hypertension, Hypothyroidism, and Osteoporosis.  She presents to the office today for multiple issues.   1 month ago we had her stop her statin to see if it help with mental clarity and joint pain.  He does report that since stopping her statin that her cognition seems to be improved and she is having less joint pain.  She feels as though the statin exacerbated her joint pain. About 5 weeks ago she was started on Fosamax due to history of osteoporosis.  She reports that every week she has some GI issues where her bowels are usually loose but this week when she took the Fosamax she had significant diarrhea.  She would like to switch over to Prolia which has already been approved through her insurance. She would like to be referred to physical therapy for left shoulder pain is more apparent with gripping motions such as opening a jar or gripping and pushing the lawnmower.  She has had an x-ray of her shoulder which showed mild degenerative changes.  She also reports that she will have issues where her left hand clenches and she has to physically away her fingers in order to open her hand.  This happens intermittently.  Does not have any cramping sensation or feels as though this is trigger finger.  Review of Systems See HPI   Past Medical History:  Diagnosis Date   Hypertension    Hypothyroidism    Osteoporosis     Social History   Socioeconomic History   Marital status: Married    Spouse name: Not on file   Number of children: Not on file   Years of education: Not on file   Highest education level: Bachelor's degree (e.g., BA, AB, BS)  Occupational History   Not on file  Tobacco Use   Smoking status: Never   Smokeless tobacco: Never  Vaping Use   Vaping status: Never Used  Substance and Sexual Activity   Alcohol use: Yes     Comment: Twice yearly   Drug use: Not on file   Sexual activity: Not on file  Other Topics Concern   Not on file  Social History Narrative   Not on file   Social Determinants of Health   Financial Resource Strain: Low Risk  (09/26/2022)   Overall Financial Resource Strain (CARDIA)    Difficulty of Paying Living Expenses: Not hard at all  Food Insecurity: No Food Insecurity (09/26/2022)   Hunger Vital Sign    Worried About Running Out of Food in the Last Year: Never true    Ran Out of Food in the Last Year: Never true  Transportation Needs: No Transportation Needs (09/26/2022)   PRAPARE - Administrator, Civil Service (Medical): No    Lack of Transportation (Non-Medical): No  Physical Activity: Insufficiently Active (09/26/2022)   Exercise Vital Sign    Days of Exercise per Week: 2 days    Minutes of Exercise per Session: 60 min  Stress: No Stress Concern Present (09/26/2022)   [employer] of Occupational Health - Occupational Stress Questionnaire    Feeling of Stress : Not at all  Social Connections: Unknown (09/26/2022)   Social Connection and Isolation Panel [NHANES]    Frequency of Communication with Friends and Family: More than three times a  week    Frequency of Social Gatherings with Friends and Family: More than three times a week    Attends Religious Services: Patient declined    Database administrator or Organizations: Patient declined    Attends Engineer, structural: Not on file    Marital Status: Married  Catering manager Violence: Not on file    Past Surgical History:  Procedure Laterality Date   CATARACT EXTRACTION, BILATERAL  2016   HAND SURGERY      Family History  Problem Relation Age of Onset   Asthma Mother    Mental illness Mother    Luiz Blare' disease Mother    COPD Mother    Heart murmur Father    Learning disabilities Sister    Hypothyroidism Sister    Hypothyroidism Sister     Allergies  Allergen Reactions    Aspirin Hives   Keflex [Cephalexin] Hives   Neo-Synephrine [Phenylephrine Hcl (Pressors)] Other (See Comments)    Migraine headaches    Penicillins Hives   Tape     Scotch tape   Wal-Profen D Cold & Sinus [Pseudoephedrine-Ibuprofen] Other (See Comments)    Current Outpatient Medications on File Prior to Visit  Medication Sig Dispense Refill   alendronate (FOSAMAX) 70 MG tablet Take 1 tablet (70 mg total) by mouth every 7 (seven) days. Take with a full glass of water on an empty stomach. 12 tablet 3   b complex vitamins capsule Take 1 capsule by mouth daily.     Beta Carotene (VITAMIN A) 25000 UNIT capsule Take 25,000 Units by mouth daily.     Cholecalciferol (VITAMIN D3) 1.25 MG (50000 UT) CAPS Take by mouth.     co-enzyme Q-10 30 MG capsule Take 30 mg by mouth 3 (three) times daily.     losartan-hydrochlorothiazide (HYZAAR) 50-12.5 MG tablet TAKE 1 TABLET BY MOUTH EVERY DAY 90 tablet 2   metoCLOPramide (REGLAN) 10 MG tablet Take 1 tablet (10 mg total) by mouth every 6 (six) hours as needed for nausea. 10 tablet 0   Omega-3 1000 MG CAPS Take by mouth. Advance Omega     ondansetron (ZOFRAN-ODT) 4 MG disintegrating tablet Take 4 mg by mouth every 8 (eight) hours as needed for nausea or vomiting.     simvastatin (ZOCOR) 20 MG tablet Take 1 tablet (20 mg total) by mouth at bedtime. 90 tablet 3   thyroid (ARMOUR THYROID) 60 MG tablet Take 1 tablet (60 mg total) by mouth daily before breakfast. 90 tablet 3   Vitamin C, Calcium Ascorbate, SOLR Take by mouth.     VITAMIN K PO Take 120 mcg by mouth daily.     No current facility-administered medications on file prior to visit.    BP 120/78   Pulse 71   Temp 97.8 F (36.6 C) (Oral)   Ht 5\' 2"  (1.575 m)   Wt 132 lb (59.9 kg)   SpO2 97%   BMI 24.14 kg/m       Objective:   Physical Exam Vitals and nursing note reviewed.  Constitutional:      Appearance: Normal appearance.  Cardiovascular:     Rate and Rhythm: Regular rhythm.   Musculoskeletal:        General: No swelling, tenderness, deformity or signs of injury. Normal range of motion.     Left shoulder: No tenderness, bony tenderness or crepitus. Normal range of motion. Normal strength.     Right hand: Normal.     Left hand: Normal. Normal  strength. Normal sensation.  Skin:    General: Skin is warm and dry.  Neurological:     General: No focal deficit present.     Mental Status: She is alert and oriented to person, place, and time.  Psychiatric:        Mood and Affect: Mood normal.        Behavior: Behavior normal.        Thought Content: Thought content normal.        Judgment: Judgment normal.       Assessment & Plan:   1. Hyperlipidemia, unspecified hyperlipidemia type - Will have her try taking her statin twice a week. If this exacerbates symptoms then can try Zetia   2. Age-related osteoporosis without current pathological fracture - We will order Prolia and have her come in for the injections   3. Chronic left shoulder pain  - Ambulatory referral to Physical Therapy   Shirline Frees, NP  Time spent with patient today was 40 minutes which consisted of chart review, discussing left shoulder/hand pain, statin intolerance and osteoporosis , work up, treatment answering questions and documentation.

## 2022-11-22 ENCOUNTER — Ambulatory Visit: Payer: Medicare HMO | Admitting: Physical Therapy

## 2022-11-22 ENCOUNTER — Encounter: Payer: Self-pay | Admitting: Physical Therapy

## 2022-11-22 NOTE — Therapy (Unsigned)
OUTPATIENT PHYSICAL THERAPY SHOULDER EVALUATION    Patient Name: Pamela Kim MRN: [id number] DOB:[birth year], 77 y.o., female Today's Date: 11/22/2022  END OF SESSION:   Past Medical History:  Diagnosis Date   Hypertension    Hypothyroidism    Osteoporosis    Past Surgical History:  Procedure Laterality Date   CATARACT EXTRACTION, BILATERAL  2016   HAND SURGERY     Patient Active Problem List   Diagnosis Date Noted   Fracture of 5th metatarsal 10/20/2011     REFERRING PROVIDER: Shirline Frees, NP  REFERRING DIAG:  Chronic left shoulder pain [M25.512, G89.29]   THERAPY DIAG:  No diagnosis found.  Rationale for Evaluation and Treatment: Rehabilitation  ONSET DATE: ***  SUBJECTIVE:                                                                                                                                                                                      SUBJECTIVE STATEMENT: *** Hand dominance: {MISC; OT HAND DOMINANCE:[phone]}  PERTINENT HISTORY: ***  PAIN:  Are you having pain? {OPRCPAIN:27236}  PRECAUTIONS: {Therapy precautions:24002}  RED FLAGS: {PT Red Flags:29287}   WEIGHT BEARING RESTRICTIONS: {Yes ***/No:24003}  FALLS:  Has patient fallen in last 6 months? {fallsyesno:27318}  LIVING ENVIRONMENT: Lives with: {OPRC lives with:25569::"lives with their family"} Lives in: {Lives in:25570} Stairs: {opstairs:27293} Has following equipment at home: {Assistive devices:23999}  OCCUPATION: ***  PLOF: {PLOF:24004}  PATIENT GOALS:***  NEXT MD VISIT:   OBJECTIVE:  Note: Objective measures were completed at Evaluation unless otherwise noted.  DIAGNOSTIC FINDINGS:  ***  PATIENT SURVEYS:  {rehab surveys:24030:a}  COGNITION: Overall cognitive status: {cognition:24006}     SENSATION: {sensation:27233}  POSTURE: ***  UPPER EXTREMITY ROM:   {AROM/PROM:27142} ROM Right eval Left eval  Shoulder flexion    Shoulder  extension    Shoulder abduction    Shoulder adduction    Shoulder internal rotation    Shoulder external rotation    Elbow flexion    Elbow extension    Wrist flexion    Wrist extension    Wrist ulnar deviation    Wrist radial deviation    Wrist pronation    Wrist supination    (Blank rows = not tested)  UPPER EXTREMITY MMT:  MMT Right eval Left eval  Shoulder flexion    Shoulder extension    Shoulder abduction    Shoulder adduction    Shoulder internal rotation    Shoulder external rotation    Middle trapezius    Lower trapezius    Elbow flexion    Elbow extension    Wrist flexion    Wrist extension    Wrist  ulnar deviation    Wrist radial deviation    Wrist pronation    Wrist supination    Grip strength (lbs)    (Blank rows = not tested)  SHOULDER SPECIAL TESTS: Impingement tests: {shoulder impingement test:25231:a} SLAP lesions: {SLAP lesions:25232} Instability tests: {shoulder instability test:25233} Rotator cuff assessment: {rotator cuff assessment:25234} Biceps assessment: {biceps assessment:25235}  JOINT MOBILITY TESTING:  ***  PALPATION:  ***   TODAY'S TREATMENT:                                                                                                                                         DATE: Creating, reviewing, and completing below HEP   PATIENT EDUCATION: Education details: Educated pt on anatomy and physiology of current symptoms, FOTO, diagnosis, prognosis, HEP,  and POC. Person educated: Patient Education method: Medical illustrator Education comprehension: verbalized understanding and returned demonstration  HOME EXERCISE PROGRAM: ***  ASSESSMENT:  CLINICAL IMPRESSION: Patient referred to PT for  .Patient will benefit from skilled PT to address below impairments, limitations and improve overall function.  OBJECTIVE IMPAIRMENTS: decreased activity tolerance, decreased shoulder mobility, decreased ROM, decreased  strength, impaired flexibility, impaired UE use, postural dysfunction, and pain.  ACTIVITY LIMITATIONS: reaching, lifting, carry,  cleaning, driving, and or occupation  PERSONAL FACTORS:  also affecting patient's functional outcome.  REHAB POTENTIAL: Good  CLINICAL DECISION MAKING: Stable/uncomplicated  EVALUATION COMPLEXITY: Low    GOALS: Short term PT Goals Target date: {follow up:25551} Pt will be I and compliant with HEP. Baseline:  Goal status: New Pt will decrease pain by 25% overall Baseline: Goal status: New  Long term PT goals Target date: {follow up:25551} Pt will improve Rt shoulder AROM to [facility] to improve functional reaching Baseline: Goal status: New Pt will improve  Rt shoulder strength to at least 4+/5 MMT to improve functional strength Baseline: Goal status: New Pt will improve FOTO to at least % functional to show improved function Baseline: Goal status: New Pt will reduce pain to overall less than 3/10 with usual activity and work activity. Baseline: Goal status: New  PLAN: PT FREQUENCY: 1***  PT DURATION: ***  PLANNED INTERVENTIONS (unless contraindicated): aquatic PT, Canalith repositioning, cryotherapy, Electrical stimulation, Iontophoresis with 4 mg/ml dexamethasome, Moist heat, traction, Ultrasound, gait training, Therapeutic exercise, balance training, neuromuscular re-education, patient/family education, prosthetic training, manual techniques, passive ROM, dry needling, taping, vasopnuematic device, vestibular, spinal manipulations, joint manipulations  PLAN FOR NEXT SESSION: review HEP   PLAN FOR NEXT SESSION: Champ Mungo, PT 11/22/2022, 11:43 AM

## 2022-11-23 ENCOUNTER — Ambulatory Visit: Payer: Medicare HMO | Attending: Adult Health

## 2022-11-23 ENCOUNTER — Other Ambulatory Visit: Payer: Self-pay

## 2022-11-23 DIAGNOSIS — R293 Abnormal posture: Secondary | ICD-10-CM | POA: Insufficient documentation

## 2022-11-23 DIAGNOSIS — G8929 Other chronic pain: Secondary | ICD-10-CM | POA: Diagnosis not present

## 2022-11-23 DIAGNOSIS — M25512 Pain in left shoulder: Secondary | ICD-10-CM | POA: Diagnosis not present

## 2022-11-23 DIAGNOSIS — M6281 Muscle weakness (generalized): Secondary | ICD-10-CM | POA: Diagnosis not present

## 2022-11-23 NOTE — Therapy (Signed)
OUTPATIENT PHYSICAL THERAPY SHOULDER EVALUATION    Patient Name: Pamela Kim MRN: [id number] DOB:May 04, 1945, 77 y.o., female Today's Date: 11/23/2022  END OF SESSION:  PT End of Session - 11/23/22 1100     Visit Number 1    Date for PT Re-Evaluation 01/18/23    Authorization Type Medicare [employer]    Progress Note Due on Visit 10    PT Start Time 1017    PT Stop Time 1058    PT Time Calculation (min) 41 min             Past Medical History:  Diagnosis Date   Hypertension    Hypothyroidism    Osteoporosis    Past Surgical History:  Procedure Laterality Date   CATARACT EXTRACTION, BILATERAL  2016   HAND SURGERY     Patient Active Problem List   Diagnosis Date Noted   Fracture of 5th metatarsal 10/20/2011     REFERRING PROVIDER: Shirline Frees, NP  REFERRING DIAG:  Chronic left shoulder pain [M25.512, G89.29]   THERAPY DIAG:  Chronic left shoulder pain - Plan: PT plan of care cert/re-cert  Abnormal posture - Plan: PT plan of care cert/re-cert  Muscle weakness (generalized) - Plan: PT plan of care cert/re-cert  Rationale for Evaluation and Treatment: Rehabilitation  ONSET DATE: 12 years ago with flare-up, pain has worsened recently with physical activity.   SUBJECTIVE:                                                                                                                                                                                      SUBJECTIVE STATEMENT: Pt presents to PT with chronic Lt shoulder pain.  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  She reports Lt shoulder pain with functional tasks.  From MD note:  left shoulder pain is more apparent with gripping motions such as opening a jar or gripping and pushing the lawnmower. She has had an x-ray of her shoulder which showed mild degenerative changes. She also reports that she will have issues where her  left hand clenches and she has to physically away her fingers in order to open her hand. This happens intermittently. Hand dominance: Right  PERTINENT HISTORY: Lt hand surgery in 2012, Bil thumb OA, osteoporosis, HTN  PAIN:  Are you having pain? Yes: NPRS scale: 4-10/10 Pain location: Lt shoulder,  Pain description: aching, intermittent with more use  Aggravating factors: repetitive use, getting dressed Relieving factors: Tylenol, rest   PRECAUTIONS: None  RED FLAGS: None   WEIGHT BEARING RESTRICTIONS: No  FALLS:  Has patient fallen in last 6 months? 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  DIAGNOSTIC FINDINGS:  X-ray Lt shoulder: IMPRESSION: Mild acromioclavicular and glenohumeral degenerative change.  PATIENT SURVEYS:  FOTO 26 (goal is 63)  COGNITION: Overall cognitive status: Within functional limits for tasks assessed     SENSATION: WFL  POSTURE: Forward head, rounded shoulders, elevated scapula with UE movement   UPPER EXTREMITY ROM:   Rt=Lt shoulder A/ROM- pain with overpressure on the Lt  UPPER EXTREMITY MMT:  Rt=Lt 4+/5 Grip: Rt 68, Lt 60   JOINT MOBILITY TESTING:  Reduced inferior glide of Lt glenohumeral joint.  Pain over Lt anterior deltoid.   PALPATION:  Trigger points in Lt upper trap and anterior deltoid. The Patient-Specific Functional Scale  Initial:  I am going to ask you to identify up to 3 important activities that you are unable to do or are having difficulty with as a result of this problem.  Today are there any activities that you are unable to do or having difficulty with because of this?  (Patient  shown scale and patient rated each activity)  Follow up: When you first came in you had difficulty performing these activities.  Today do you still have difficulty?  Patient-Specific activity scoring scheme (Point to one number):  0 1 2 3 4 5 6 7 8 9  10 Unable                                                                                                          Able to perform To perform                                                                                                    activity at the same Activity         Level as before  Injury or problem  Activity  Putting on bra                                                                              Initial:    3/10                   follow up:  2.    Reaching overhead                                                                    Initial:    3-4/10                   follow up:      TODAY'S TREATMENT:                                                                                                                                         DATE:  HEP established- see below   PATIENT EDUCATION: Education details: Access Code: [email or web address] Person educated: Patient Education method: Explanation and Demonstration Education comprehension: verbalized understanding and returned demonstration  HOME EXERCISE PROGRAM: Access Code: [email or web address] URL: [email or web address] Date: 11/23/2022 Prepared by: Tresa Endo  Exercises - Shoulder Flexion Wall Slide with Towel  - 2-3 x daily - 7 x weekly - 1 sets - 10 reps - 10 hold - Seated Correct Posture  - 2 x daily - 7 x weekly - 1 sets - 10 reps - Seated Scapular Retraction  - 1 x daily - 7 x weekly - 3 sets - 10 reps - Scapular Retraction with Resistance  - 2 x daily - 7 x weekly - 2 sets - 10 reps - Shoulder External Rotation and Scapular Retraction with Resistance  -  2 x daily - 7 x weekly - 2 sets - 10 reps  ASSESSMENT:  CLINICAL IMPRESSION: Patient referred to PT for Lt shoulder pain of a chronic nature. Pt reports that she had surgery to her Lt hand and has had problems since then.  She also describes hand weakness bilaterally due to significant OA in her hands. Pt reports most difficulty and pian with putting on her bra and with reaching overhead.  Significant postural dysfunction.  Palpable tenderness and trigger points over Lt neck and anterior shoulder.  Patient will benefit from skilled PT to address below impairments, limitations and improve overall  function.  OBJECTIVE IMPAIRMENTS: decreased activity tolerance, decreased shoulder mobility, decreased ROM, decreased strength, impaired flexibility, impaired UE use, postural dysfunction, and pain.  ACTIVITY LIMITATIONS: reaching, lifting, carry,  cleaning, driving, and or occupation  PERSONAL FACTORS:  also affecting patient's functional outcome.  REHAB POTENTIAL: Good  CLINICAL DECISION MAKING: Stable/uncomplicated  EVALUATION COMPLEXITY: Low    GOALS: Short term PT Goals Target date: 12/21/2022   Pt will be I and compliant with HEP. Baseline:  Goal status: New Pt will decrease Lt shoulder pain by > or = to 30% with reaching and dressing  Baseline: 4-10/10 Goal status: New  Long term PT goals Target date: 01/18/2023   Be independent in advanced HEP Baseline: Goal status: New Pt will improve  improve Patient specific functional scale score for putting on bra to > or = to 7/10 Baseline:3/10 Goal status: New Pt will improve FOTO to at least 68 to show improved function Baseline:59 Goal status: New Pt will improve  improve Patient specific functional scale score for reaching overhead to > or = to 7/10 Baseline:3-4/10 Goal status: New  5.  Improve Lt grip strength to > or = to 65# to improve holding objects with Lt UE  PLAN: PT FREQUENCY: 2x  PT DURATION: 8  PLANNED  INTERVENTIONS (unless contraindicated): aquatic PT, Canalith repositioning, cryotherapy, Electrical stimulation, Iontophoresis with 4 mg/ml dexamethasome, Moist heat, traction, Ultrasound, gait training, Therapeutic exercise, balance training, neuromuscular re-education, patient/family education, prosthetic training, manual techniques, passive ROM, dry needling, taping, vasopnuematic device, vestibular, spinal manipulations, joint manipulations   PLAN FOR NEXT SESSION: postural strength, Lt shoulder strength, stability and ROM  Lorrene Reid, PT 11/23/22 1:25 PM   [facility] [address] [place], [place] [address] Phone # [phone] Fax [phone]

## 2022-11-30 ENCOUNTER — Ambulatory Visit: Payer: Medicare HMO

## 2022-11-30 DIAGNOSIS — R293 Abnormal posture: Secondary | ICD-10-CM | POA: Diagnosis not present

## 2022-11-30 DIAGNOSIS — M6281 Muscle weakness (generalized): Secondary | ICD-10-CM

## 2022-11-30 DIAGNOSIS — M25512 Pain in left shoulder: Secondary | ICD-10-CM | POA: Diagnosis not present

## 2022-11-30 DIAGNOSIS — G8929 Other chronic pain: Secondary | ICD-10-CM

## 2022-11-30 NOTE — Therapy (Addendum)
 OUTPATIENT PHYSICAL THERAPY SHOULDER TREATMENT   Patient Name: Pamela Kim MRN: [id number] DOB:1946-01-06, 77 y.o., female Today's Date: 11/30/2022  END OF SESSION:  PT End of Session - 11/30/22 1057     Visit Number 2    Date for PT Re-Evaluation 01/18/23    Authorization Type Medicare [employer]    Progress Note Due on Visit 10    PT Start Time 1015    PT Stop Time 1057    PT Time Calculation (min) 42 min    Activity Tolerance Patient tolerated treatment well    Behavior During Therapy WFL for tasks assessed/performed              Past Medical History:  Diagnosis Date   Hypertension    Hypothyroidism    Osteoporosis    Past Surgical History:  Procedure Laterality Date   CATARACT EXTRACTION, BILATERAL  2016   HAND SURGERY     Patient Active Problem List   Diagnosis Date Noted   Fracture of 5th metatarsal 10/20/2011     REFERRING PROVIDER: Merna Huxley, NP  REFERRING DIAG:  Chronic left shoulder pain [M25.512, G89.29]   THERAPY DIAG:  Chronic left shoulder pain  Abnormal posture  Muscle weakness (generalized)  Rationale for Evaluation and Treatment: Rehabilitation  ONSET DATE: 12 years ago with flare-up, pain has worsened recently with physical activity.   SUBJECTIVE:                                                                                                                                                                                      SUBJECTIVE STATEMENT: I've been doing my exercises.  My Lt shoulder is sore.  From MD note:  left shoulder pain is more apparent with gripping motions such as opening a jar or gripping and pushing the lawnmower. She has had an x-ray of her shoulder which showed mild degenerative changes. She also reports that she will have issues where her left hand clenches and she has to physically away her fingers in order to open her hand. This happens intermittently. Hand dominance: Right  PERTINENT HISTORY: Lt  hand surgery in 2012, Bil thumb OA, osteoporosis, HTN  PAIN:  Are you having pain? Yes: NPRS scale: 3/10 Pain location: Lt shoulder,  Pain description: aching, intermittent with more use  Aggravating factors: repetitive use, getting dressed Relieving factors: Tylenol, rest   PRECAUTIONS: None  RED FLAGS: None   WEIGHT BEARING RESTRICTIONS: No  FALLS:  Has patient fallen in last 6 months? 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  DIAGNOSTIC FINDINGS:  X-ray Lt shoulder: IMPRESSION: Mild acromioclavicular and glenohumeral degenerative change.  PATIENT SURVEYS:  FOTO 77 (goal is 70)  COGNITION: Overall cognitive status: Within functional limits for tasks assessed     SENSATION: WFL  POSTURE: Forward head, rounded shoulders, elevated scapula with UE movement   UPPER EXTREMITY ROM:   Rt=Lt shoulder A/ROM- pain with overpressure on the Lt  UPPER EXTREMITY MMT:  Rt=Lt 4+/5 Grip: Rt 68, Lt 60   JOINT MOBILITY TESTING:  Reduced inferior glide of Lt glenohumeral joint.  Pain over Lt anterior deltoid.   PALPATION:  Trigger points in Lt upper trap and anterior deltoid. The Patient-Specific Functional Scale  Initial:  I am going to ask you to identify up to 3 important activities that you are unable to do or are having difficulty with as a result of this problem.  Today are there any activities that you are unable to do or having difficulty with because of this?  (Patient shown scale and patient rated each activity)  Follow up: When you first came in you had difficulty performing these activities.  Today do you still have  difficulty?  Patient-Specific activity scoring scheme (Point to one number):  0 1 2 3 4 5 6 7 8 9  10 Unable                                                                                                          Able to perform To perform                                                                                                    activity at the same Activity         Level as before                                                                                                                       Injury or problem  Activity  Putting on bra                                                                              Initial:    3/10                   follow up:  2.    Reaching overhead                                                                    Initial:    3-4/10                   follow up:      TODAY'S TREATMENT:    DATE: 11/30/22 Arm Bike: level 1x 6 min (3/3)-PT present to discuss progress UE ranger: flexion to simulate table slides         ER and row with red band: 2x10 bil each.  -tactile and verbal cues for technique.   3 way raises: 1.5# added 2x10 on Lt Wall clocks: green loop 2x10                                                                                                                          DATE: 11/23/22 HEP established- see below   PATIENT EDUCATION: Education details: Access Code: [email or web address] Person educated: Patient Education method: Explanation and Demonstration Education comprehension: verbalized understanding and returned demonstration  HOME EXERCISE PROGRAM: Access Code: [email or web address] URL: [email or web address] Date: 11/23/2022 Prepared by: Burnard  Exercises - Shoulder Flexion Wall Slide with Towel  - 2-3 x daily - 7 x weekly - 1 sets - 10 reps - 10 hold - Seated Correct Posture  - 2 x daily - 7 x weekly - 1 sets - 10 reps - Seated Scapular Retraction  - 1 x daily - 7 x weekly - 3 sets - 10 reps - Scapular Retraction with  Resistance  - 2 x daily - 7 x weekly - 2 sets - 10 reps - Shoulder External Rotation and Scapular Retraction with Resistance  - 2 x daily - 7 x weekly - 2 sets - 10 reps  ASSESSMENT:  CLINICAL IMPRESSION:  First time follow-up after evaluation.  PT provided verbal and tactile cues for technique with band exercises.  Pt tolerated all exercise without increased pain.  Intermittent cues for scapular depression.  Pt educated pt on importance of postural strength and relationship to shoulder pain.  Patient will benefit from skilled PT to address below impairments, limitations and improve overall function.  OBJECTIVE IMPAIRMENTS: decreased activity tolerance, decreased shoulder mobility, decreased ROM, decreased strength, impaired flexibility, impaired UE use, postural dysfunction, and pain.  ACTIVITY LIMITATIONS: reaching, lifting, carry,  cleaning, driving, and or occupation  PERSONAL FACTORS:  also affecting patient's functional outcome.  REHAB POTENTIAL: Good  CLINICAL DECISION MAKING: Stable/uncomplicated  EVALUATION COMPLEXITY: Low    GOALS: Short term PT Goals Target date: 12/21/2022   Pt will be I and compliant with HEP. Baseline:  Goal status: in progress   Pt will decrease Lt shoulder pain by > or = to 30% with reaching and dressing  Baseline: 4-10/10 Goal status: New  Long term PT goals Target date: 01/18/2023   Be independent in advanced HEP Baseline: Goal status: New Pt will improve  improve Patient specific functional scale score for putting on bra to > or = to 7/10 Baseline:3/10 Goal status: New Pt will improve FOTO to at least 68 to show improved function Baseline:59 Goal status: New Pt will improve  improve Patient specific functional scale score for reaching overhead to > or = to 7/10 Baseline:3-4/10 Goal status: New  5.  Improve Lt grip strength to > or = to 65# to improve holding objects with Lt UE  PLAN: PT FREQUENCY: 2x  PT DURATION: 8  PLANNED  INTERVENTIONS (unless contraindicated): aquatic PT, Canalith repositioning, cryotherapy, Electrical stimulation, Iontophoresis with 4 mg/ml dexamethasome, Moist heat, traction, Ultrasound, gait training, Therapeutic exercise, balance training, neuromuscular re-education, patient/family education, prosthetic training, manual techniques, passive ROM, dry needling, taping, vasopnuematic device, vestibular, spinal manipulations, joint manipulations   PLAN FOR NEXT SESSION: postural strength, Lt shoulder strength, stability and ROM.  DN to Lt subscapularis, UT and rhomboids  Burnard Joy, PT 11/30/22 10:57 AM  PHYSICAL THERAPY DISCHARGE SUMMARY  Visits from Start of Care: 2  Current functional level related to goals / functional outcomes: See above for most recent PT status.  Pt didn't return to PT.    Remaining deficits: See above    Education / Equipment: HEP, posture    Patient agrees to discharge. Patient goals were not met. Patient is being discharged due to not returning since the last visit.  Burnard Joy, PT 02/07/23 7:51 AM   [facility] [address], [address] [place], [place] [address] Phone # [phone] Fax [phone]

## 2022-12-06 ENCOUNTER — Ambulatory Visit: Payer: Medicare HMO | Admitting: Physical Therapy

## 2022-12-15 ENCOUNTER — Ambulatory Visit: Payer: Medicare HMO | Admitting: Rehabilitative and Restorative Service Providers"

## 2023-02-15 ENCOUNTER — Encounter: Payer: Self-pay | Admitting: Adult Health

## 2023-02-15 ENCOUNTER — Ambulatory Visit (INDEPENDENT_AMBULATORY_CARE_PROVIDER_SITE_OTHER): Payer: PPO | Admitting: Adult Health

## 2023-02-15 VITALS — BP 132/80 | HR 88 | Temp 98.2°F | Ht 62.0 in | Wt 132.0 lb

## 2023-02-15 DIAGNOSIS — E54 Ascorbic acid deficiency: Secondary | ICD-10-CM

## 2023-02-15 DIAGNOSIS — E782 Mixed hyperlipidemia: Secondary | ICD-10-CM | POA: Diagnosis not present

## 2023-02-15 MED ORDER — ONDANSETRON 4 MG PO TBDP: 4.0000 mg | ORAL_TABLET | Freq: Three times a day (TID) | ORAL | 2 refills | Status: DC | PRN

## 2023-02-15 NOTE — Progress Notes (Signed)
 Subjective:    Patient ID: Pamela Kim, female    DOB: 1945/06/04, 78 y.o.   MRN: [id number]  HPI 78 year old female who  has a past medical history of Hypertension, Hypothyroidism, and Osteoporosis.  She presents to the office today for follow up.  He was last seen back in October 2024.  At this time she complained of fatigue, body aches, brain fog.  There was also supposedly having swollen bleeding gums and sores in her mouth.  She thought it was her statin and so we tried switching this to taking it twice a day.   She realized that she was not eating a whole lot of fruits and vegetables in her diet had suffered from going back and forth between [place] and [place] area to help take care of her grandkids.  She started taking vitamin C supplements about 3 weeks ago and her symptoms nearly resolved.  She has also increased her daily intake of vitamin C through fruits and vegetables as well.   Review of Systems See HPI   Past Medical History:  Diagnosis Date   Hypertension    Hypothyroidism    Osteoporosis     Social History   Socioeconomic History   Marital status: Married    Spouse name: Not on file   Number of children: Not on file   Years of education: Not on file   Highest education level: Bachelor's degree (e.g., BA, AB, BS)  Occupational History   Not on file  Tobacco Use   Smoking status: Never   Smokeless tobacco: Never  Vaping Use   Vaping status: Never Used  Substance and Sexual Activity   Alcohol use: Yes    Comment: Twice yearly   Drug use: Not on file   Sexual activity: Not on file  Other Topics Concern   Not on file  Social History Narrative   Not on file   Social Drivers of Health   Financial Resource Strain: Low Risk  (09/26/2022)   Overall Financial Resource Strain (CARDIA)    Difficulty of Paying Living Expenses: Not hard at all  Food Insecurity: No Food Insecurity (09/26/2022)   Hunger Vital Sign    Worried About Running Out of Food  in the Last Year: Never true    Ran Out of Food in the Last Year: Never true  Transportation Needs: No Transportation Needs (09/26/2022)   PRAPARE - Administrator, Civil Service (Medical): No    Lack of Transportation (Non-Medical): No  Physical Activity: Insufficiently Active (09/26/2022)   Exercise Vital Sign    Days of Exercise per Week: 2 days    Minutes of Exercise per Session: 60 min  Stress: No Stress Concern Present (09/26/2022)   [employer] of Occupational Health - Occupational Stress Questionnaire    Feeling of Stress : Not at all  Social Connections: Unknown (09/26/2022)   Social Connection and Isolation Panel [NHANES]    Frequency of Communication with Friends and Family: More than three times a week    Frequency of Social Gatherings with Friends and Family: More than three times a week    Attends Religious Services: Patient declined    Database Administrator or Organizations: Patient declined    Attends Banker Meetings: Not on file    Marital Status: Married  Catering Manager Violence: Not on file    Past Surgical History:  Procedure Laterality Date   CATARACT EXTRACTION, BILATERAL  2016   HAND  SURGERY      Family History  Problem Relation Age of Onset   Asthma Mother    Mental illness Mother    Yvone' disease Mother    COPD Mother    Heart murmur Father    Learning disabilities Sister    Hypothyroidism Sister    Hypothyroidism Sister     Allergies  Allergen Reactions   Aspirin Hives   Keflex [Cephalexin] Hives   Neo-Synephrine [Phenylephrine Hcl (Pressors)] Other (See Comments)    Migraine headaches    Penicillins Hives   Tape     Scotch tape   Wal-Profen D Cold & Sinus [Pseudoephedrine-Ibuprofen] Other (See Comments)    Current Outpatient Medications on File Prior to Visit  Medication Sig Dispense Refill   b complex vitamins capsule Take 1 capsule by mouth daily.     Beta Carotene (VITAMIN A) 25000 UNIT capsule  Take 25,000 Units by mouth daily.     Cholecalciferol (VITAMIN D3) 1.25 MG (50000 UT) CAPS Take by mouth.     co-enzyme Q-10 30 MG capsule Take 30 mg by mouth 3 (three) times daily.     losartan -hydrochlorothiazide (HYZAAR) 50-12.5 MG tablet TAKE 1 TABLET BY MOUTH EVERY DAY 90 tablet 2   metoCLOPramide  (REGLAN ) 10 MG tablet Take 1 tablet (10 mg total) by mouth every 6 (six) hours as needed for nausea. 10 tablet 0   Omega-3 1000 MG CAPS Take by mouth. Advance Omega     ondansetron  (ZOFRAN -ODT) 4 MG disintegrating tablet Take 4 mg by mouth every 8 (eight) hours as needed for nausea or vomiting.     thyroid  (ARMOUR THYROID ) 60 MG tablet Take 1 tablet (60 mg total) by mouth daily before breakfast. 90 tablet 3   Vitamin C, Calcium Ascorbate, SOLR Take by mouth.     VITAMIN K PO Take 120 mcg by mouth daily.     No current facility-administered medications on file prior to visit.    BP 132/80   Pulse 88   Temp 98.2 F (36.8 C) (Oral)   Ht 5' 2 (1.575 m)   Wt 132 lb (59.9 kg)   SpO2 97%   BMI 24.14 kg/m       Objective:   Physical Exam Vitals and nursing note reviewed.  Constitutional:      Appearance: Normal appearance.  Cardiovascular:     Rate and Rhythm: Normal rate and regular rhythm.     Pulses: Normal pulses.     Heart sounds: Normal heart sounds.  Pulmonary:     Effort: Pulmonary effort is normal.     Breath sounds: Normal breath sounds.  Musculoskeletal:        General: Normal range of motion.  Skin:    General: Skin is warm and dry.  Neurological:     General: No focal deficit present.     Mental Status: She is alert and oriented to person, place, and time.  Psychiatric:        Mood and Affect: Mood normal.        Behavior: Behavior normal.        Thought Content: Thought content normal.        Judgment: Judgment normal.       Assessment & Plan:  1. Vitamin C deficiency (Primary) -Notable diagnosis of vitamin C deficiency.  Will have her continue with healthy  diet can likely stop vitamin C supplementation within the next couple weeks since she is eating better.  We will check vitamin C levels  to make sure they are adequate. - Vitamin C; Future  2. Mixed hyperlipidemia -Have her restart her statin follow-up in 3 months

## 2023-03-01 ENCOUNTER — Encounter: Payer: Self-pay | Admitting: Adult Health

## 2023-03-02 ENCOUNTER — Encounter: Payer: Self-pay | Admitting: Adult Health

## 2023-03-08 ENCOUNTER — Other Ambulatory Visit (HOSPITAL_COMMUNITY): Payer: Self-pay

## 2023-03-08 ENCOUNTER — Telehealth: Payer: Self-pay

## 2023-03-08 NOTE — Telephone Encounter (Signed)
PA request has been Started. New Encounter created for follow up. For additional info see Pharmacy Prior Auth telephone encounter from 02/04.

## 2023-03-08 NOTE — Telephone Encounter (Signed)
*  Primary  Prior Authorization form/request asks a question that requires your assistance. Please see the question below and advise accordingly. The PA will not be submitted until the necessary information is received.   What is the diagnosis for the patient other than nausea/vomiting? Ondansetron  is typically covered for after Chemo symptoms and similar reasons.   Test claims show phenergan  is preferred, if this is appropriate please send in this medication to the patients pharmacy.

## 2023-03-08 NOTE — Telephone Encounter (Signed)
R11.2 for nausea and vomiting.

## 2023-03-21 ENCOUNTER — Other Ambulatory Visit (HOSPITAL_COMMUNITY): Payer: Self-pay

## 2023-03-21 ENCOUNTER — Telehealth: Payer: Self-pay

## 2023-03-21 NOTE — Telephone Encounter (Signed)
PA request has been Submitted. New Encounter created for follow up. For additional info see Pharmacy Prior Auth telephone encounter from 03/21/23.

## 2023-03-21 NOTE — Telephone Encounter (Signed)
Pharmacy Patient Advocate Encounter   Received notification from Pt Calls Messages that prior authorization for Ondansetron 4MG  dispersible tablets is required/requested.   Insurance verification completed.   The patient is insured through [facility] [employer] .   Per test claim: PA required; PA submitted to above mentioned insurance via CoverMyMeds Key/confirmation #/EOC [id number] Status is pending

## 2023-03-23 ENCOUNTER — Other Ambulatory Visit (HOSPITAL_COMMUNITY): Payer: Self-pay

## 2023-03-23 NOTE — Telephone Encounter (Signed)
Placed a call to [employer] to check the status of the PA.   Per there representative, the PA was submitted on 03/21/23 and we should receive a determination by 03/28/23.

## 2023-03-25 ENCOUNTER — Encounter: Payer: Self-pay | Admitting: Adult Health

## 2023-03-25 ENCOUNTER — Other Ambulatory Visit: Payer: Self-pay | Admitting: Adult Health

## 2023-03-25 MED ORDER — ONDANSETRON HCL 4 MG PO TABS: 4.0000 mg | ORAL_TABLET | Freq: Three times a day (TID) | ORAL | 0 refills | Status: AC | PRN

## 2023-03-25 NOTE — Telephone Encounter (Signed)
Please advise. Pt stated that she takes once a year when she has a severe episode of vomiting. Pt wants something to have on hand and requesting alternative.

## 2023-03-25 NOTE — Telephone Encounter (Signed)
Pharmacy Patient Advocate Encounter  Received notification from [facility] [employer] that Prior Authorization for Ondansetron has been DENIED.  Full denial letter will be uploaded to the media tab. See denial reason below.    PA #/Case ID/Reference #: See below

## 2023-03-25 NOTE — Telephone Encounter (Signed)
 Left detailed message informing  of update.

## 2023-03-30 ENCOUNTER — Other Ambulatory Visit (HOSPITAL_COMMUNITY): Payer: Self-pay

## 2023-04-06 ENCOUNTER — Ambulatory Visit: Payer: PPO | Admitting: Adult Health

## 2023-04-07 ENCOUNTER — Ambulatory Visit: Payer: PPO | Admitting: Adult Health

## 2023-05-12 ENCOUNTER — Encounter: Payer: Self-pay | Admitting: Adult Health

## 2023-05-12 ENCOUNTER — Ambulatory Visit (INDEPENDENT_AMBULATORY_CARE_PROVIDER_SITE_OTHER): Admitting: Adult Health

## 2023-05-12 VITALS — BP 130/80 | HR 98 | Temp 97.9°F | Ht 62.0 in | Wt 139.0 lb

## 2023-05-12 DIAGNOSIS — G8929 Other chronic pain: Secondary | ICD-10-CM

## 2023-05-12 DIAGNOSIS — E782 Mixed hyperlipidemia: Secondary | ICD-10-CM | POA: Diagnosis not present

## 2023-05-12 DIAGNOSIS — M25512 Pain in left shoulder: Secondary | ICD-10-CM

## 2023-05-12 DIAGNOSIS — I10 Essential (primary) hypertension: Secondary | ICD-10-CM

## 2023-05-12 MED ORDER — SIMVASTATIN 10 MG PO TABS
10.0000 mg | ORAL_TABLET | ORAL | 3 refills | Status: DC
Start: 2023-05-12 — End: 2023-06-28

## 2023-05-12 MED ORDER — LOSARTAN POTASSIUM-HCTZ 50-12.5 MG PO TABS
1.0000 | ORAL_TABLET | Freq: Every day | ORAL | 2 refills | Status: DC
Start: 2023-05-12 — End: 2023-12-20

## 2023-05-12 NOTE — Progress Notes (Signed)
 Subjective:    Patient ID: Pamela Kim, female    DOB: 1945-10-28, 78 y.o.   MRN: [id number]  HPI She is a pleasant 78 year old female who  has a past medical history of Hypertension, Hypothyroidism, and Osteoporosis.  She presents to the office today for follow-up regarding multiple issues  Hyperlipidemia-again January 2025 we had her switch her simvastatin from taking daily to taking twice weekly due to side effects of myalgia.  She reports that since switching her simvastatin to twice weekly has not been experiencing any side effects.  Chronic left shoulder pain-has been ongoing for quite some time.  We did do an x-ray of her left shoulder back in August 2024 showed mild acromioclavicular and glenohumeral degenerative changes.  She has worsening pain especially with movements -overactivity it feels like "a knife going into my shoulder".  She has loss of range of motion.  She did go through physical therapy but found this to be painful and after her session she would go days without being able to use her left shoulder.  She also needs a refill of her blood pressure medication    Review of Systems See HPI   Past Medical History:  Diagnosis Date   Hypertension    Hypothyroidism    Osteoporosis     Social History   Socioeconomic History   Marital status: Married    Spouse name: Not on file   Number of children: Not on file   Years of education: Not on file   Highest education level: Bachelor's degree (e.g., BA, AB, BS)  Occupational History   Not on file  Tobacco Use   Smoking status: Never   Smokeless tobacco: Never  Vaping Use   Vaping status: Never Used  Substance and Sexual Activity   Alcohol use: Yes    Comment: Twice yearly   Drug use: Not on file   Sexual activity: Not on file  Other Topics Concern   Not on file  Social History Narrative   Not on file   Social Drivers of Health   Financial Resource Strain: Patient Declined (05/08/2023)   Overall  Financial Resource Strain (CARDIA)    Difficulty of Paying Living Expenses: Patient declined  Food Insecurity: Patient Declined (05/08/2023)   Hunger Vital Sign    Worried About Running Out of Food in the Last Year: Patient declined    Ran Out of Food in the Last Year: Patient declined  Transportation Needs: Patient Declined (05/08/2023)   PRAPARE - Administrator, Civil Service (Medical): Patient declined    Lack of Transportation (Non-Medical): Patient declined  Physical Activity: Sufficiently Active (05/08/2023)   Exercise Vital Sign    Days of Exercise per Week: 5 days    Minutes of Exercise per Session: 40 min  Stress: No Stress Concern Present (05/08/2023)   [employer] of Occupational Health - Occupational Stress Questionnaire    Feeling of Stress : Not at all  Social Connections: Unknown (05/08/2023)   Social Connection and Isolation Panel [NHANES]    Frequency of Communication with Friends and Family: Patient declined    Frequency of Social Gatherings with Friends and Family: Patient declined    Attends Religious Services: Patient declined    Database administrator or Organizations: Patient declined    Attends Banker Meetings: Not on file    Marital Status: Patient declined  Intimate Partner Violence: Not on file    Past Surgical History:  Procedure Laterality  Date   CATARACT EXTRACTION, BILATERAL  2016   HAND SURGERY      Family History  Problem Relation Age of Onset   Asthma Mother    Mental illness Mother    Luiz Blare' disease Mother    COPD Mother    Heart murmur Father    Learning disabilities Sister    Hypothyroidism Sister    Hypothyroidism Sister     Allergies  Allergen Reactions   Aspirin Hives   Keflex [Cephalexin] Hives   Neo-Synephrine [Phenylephrine Hcl (Pressors)] Other (See Comments)    Migraine headaches    Penicillins Hives   Tape     Scotch tape   Wal-Profen D Cold & Sinus [Pseudoephedrine-Ibuprofen] Other (See  Comments)    Current Outpatient Medications on File Prior to Visit  Medication Sig Dispense Refill   b complex vitamins capsule Take 1 capsule by mouth daily.     Beta Carotene (VITAMIN A) 25000 UNIT capsule Take 25,000 Units by mouth daily.     Cholecalciferol (VITAMIN D3) 1.25 MG (50000 UT) CAPS Take by mouth.     co-enzyme Q-10 30 MG capsule Take 30 mg by mouth 3 (three) times daily.     losartan-hydrochlorothiazide (HYZAAR) 50-12.5 MG tablet TAKE 1 TABLET BY MOUTH EVERY DAY 90 tablet 2   Omega-3 1000 MG CAPS Take by mouth. Advance Omega     ondansetron (ZOFRAN) 4 MG tablet Take 1 tablet (4 mg total) by mouth every 8 (eight) hours as needed for nausea or vomiting. 20 tablet 0   thyroid (ARMOUR THYROID) 60 MG tablet Take 1 tablet (60 mg total) by mouth daily before breakfast. 90 tablet 3   Vitamin C, Calcium Ascorbate, SOLR Take by mouth.     VITAMIN K PO Take 120 mcg by mouth daily.     No current facility-administered medications on file prior to visit.    BP 130/80   Pulse 98   Temp 97.9 F (36.6 C) (Oral)   Ht 5\' 2"  (1.575 m)   Wt 139 lb (63 kg)   SpO2 98%   BMI 25.42 kg/m       Objective:   Physical Exam Vitals and nursing note reviewed.  Constitutional:      Appearance: Normal appearance.  Musculoskeletal:     Left shoulder: Deformity (she has a calcified nodule on her AC) and bony tenderness present. Decreased range of motion. Decreased strength.  Skin:    General: Skin is warm and dry.     Capillary Refill: Capillary refill takes less than 2 seconds.  Neurological:     General: No focal deficit present.     Mental Status: She is alert and oriented to person, place, and time.  Psychiatric:        Mood and Affect: Mood normal.        Behavior: Behavior normal.        Thought Content: Thought content normal.        Judgment: Judgment normal.       Assessment & Plan:  1. Chronic left shoulder pain (Primary) - MR SHOULDER LEFT WO CONTRAST; Future - Due to  ongoing and worsening pain with loss of ROM will order MRI of left shoulder. Felt likely to be arthritic but cannot r/o rotator cuff injury.  2. Mixed hyperlipidemia  - simvastatin (ZOCOR) 10 MG tablet; Take 1 tablet (10 mg total) by mouth 2 (two) times a week.  Dispense: 45 tablet; Refill: 3 - Lipid panel; Future - Hepatic function  panel; Future  3. Essential hypertension - Well controlled. No change - losartan-hydrochlorothiazide (HYZAAR) 50-12.5 MG tablet; Take 1 tablet by mouth daily.  Dispense: 90 tablet; Refill: 2  Shirline Frees, NP

## 2023-05-16 ENCOUNTER — Other Ambulatory Visit

## 2023-05-28 ENCOUNTER — Ambulatory Visit
Admission: RE | Admit: 2023-05-28 | Discharge: 2023-05-28 | Disposition: A | Discharge status: Home / Self Care | Source: Ambulatory Visit | Attending: Adult Health

## 2023-05-28 DIAGNOSIS — M19012 Primary osteoarthritis, left shoulder: Secondary | ICD-10-CM | POA: Diagnosis not present

## 2023-05-28 DIAGNOSIS — M75112 Incomplete rotator cuff tear or rupture of left shoulder, not specified as traumatic: Secondary | ICD-10-CM | POA: Diagnosis not present

## 2023-05-28 DIAGNOSIS — G8929 Other chronic pain: Secondary | ICD-10-CM

## 2023-05-31 NOTE — Telephone Encounter (Signed)
 Prescription sent in for the regular tablets- patient told she could pay out of pocket if needed.

## 2023-06-02 ENCOUNTER — Ambulatory Visit: Admitting: Adult Health

## 2023-06-06 ENCOUNTER — Telehealth: Payer: Self-pay

## 2023-06-06 ENCOUNTER — Telehealth: Admitting: Physician Assistant

## 2023-06-06 DIAGNOSIS — U071 COVID-19: Secondary | ICD-10-CM | POA: Diagnosis not present

## 2023-06-06 MED ORDER — NIRMATRELVIR/RITONAVIR (PAXLOVID) TABLET (RENAL DOSING): 2.0000 | ORAL_TABLET | Freq: Two times a day (BID) | ORAL | 0 refills | Status: AC

## 2023-06-06 NOTE — Patient Instructions (Signed)
 Pamela Kim, thank you for joining Angelia Kelp, PA-C for today's virtual visit.  While this provider is not your primary care provider (PCP), if your PCP is located in our provider database this encounter information will be shared with them immediately following your visit.   A [facility] MyChart account gives you access to today's visit and all your visits, tests, and labs performed at [facility] " click here if you don't have a [facility] MyChart account or go to [email or web address]  Consent: (Patient) Pamela Kim provided verbal consent for this virtual visit at the beginning of the encounter.  Current Medications:  Current Outpatient Medications:    nirmatrelvir /ritonavir , renal dosing, (PAXLOVID ) 10 x 150 MG & 10 x 100MG  TABS, Take 2 tablets by mouth 2 (two) times daily for 5 days. (Take nirmatrelvir  150 mg one tablet twice daily for 5 days and ritonavir  100 mg one tablet twice daily for 5 days) Patient GFR is 46, Disp: 20 tablet, Rfl: 0   b complex vitamins capsule, Take 1 capsule by mouth daily., Disp: , Rfl:    Beta Carotene (VITAMIN A) 25000 UNIT capsule, Take 25,000 Units by mouth daily., Disp: , Rfl:    Cholecalciferol (VITAMIN D3) 1.25 MG (50000 UT) CAPS, Take by mouth., Disp: , Rfl:    co-enzyme Q-10 30 MG capsule, Take 30 mg by mouth 3 (three) times daily., Disp: , Rfl:    losartan -hydrochlorothiazide (HYZAAR) 50-12.5 MG tablet, Take 1 tablet by mouth daily., Disp: 90 tablet, Rfl: 2   Omega-3 1000 MG CAPS, Take by mouth. Advance Omega, Disp: , Rfl:    ondansetron  (ZOFRAN ) 4 MG tablet, Take 1 tablet (4 mg total) by mouth every 8 (eight) hours as needed for nausea or vomiting., Disp: 20 tablet, Rfl: 0   simvastatin  (ZOCOR ) 10 MG tablet, Take 1 tablet (10 mg total) by mouth 2 (two) times a week., Disp: 45 tablet, Rfl: 3   thyroid  (ARMOUR THYROID ) 60 MG tablet, Take 1 tablet (60 mg total) by mouth daily before breakfast., Disp: 90 tablet,  Rfl: 3   Vitamin C, Calcium Ascorbate, SOLR, Take by mouth., Disp: , Rfl:    VITAMIN K PO, Take 120 mcg by mouth daily., Disp: , Rfl:    Medications ordered in this encounter:  Meds ordered this encounter  Medications   nirmatrelvir /ritonavir , renal dosing, (PAXLOVID ) 10 x 150 MG & 10 x 100MG  TABS    Sig: Take 2 tablets by mouth 2 (two) times daily for 5 days. (Take nirmatrelvir  150 mg one tablet twice daily for 5 days and ritonavir  100 mg one tablet twice daily for 5 days) Patient GFR is 46    Dispense:  20 tablet    Refill:  0    Supervising Provider:   Corine Dice [phone]     *If you need refills on other medications prior to your next appointment, please contact your pharmacy*  Follow-Up: Call back or seek an in-person evaluation if the symptoms worsen or if the condition fails to improve as anticipated.  [facility] [phone]  Care Instructions: Can take to lessen severity: Vit C 500mg  twice daily Quercertin 250-500mg  twice daily Zinc 75-100mg  daily Melatonin 3-6 mg at bedtime Vit D3 1000-2000 IU daily Aspirin 81 mg daily with food Optional: Famotidine  20mg  daily Also can add tylenol/ibuprofen as needed for fevers and body aches May add Mucinex or Mucinex DM as needed for cough/congestion    Isolation Instructions: You are to isolate  at home until you have been fever free for at least 24 hours without a fever-reducing medication, and symptoms have been steadily improving for 24 hours. At that time,  you can end isolation but need to mask for an additional 5 days.   If you must be around other household members who do not have symptoms, you need to make sure that both you and the family members are masking consistently with a high-quality mask.  If you note any worsening of symptoms despite treatment, please seek an in-person evaluation ASAP. If you note any significant shortness of breath or any chest pain, please seek ER evaluation. Please do not  delay care!   COVID-19: What to Do if You Are Sick If you test positive and are an older adult or someone who is at high risk of getting very sick from COVID-19, treatment may be available. Contact a healthcare provider right away after a positive test to determine if you are eligible, even if your symptoms are mild right now. You can also visit a Test to Treat location and, if eligible, receive a prescription from a provider. Don't delay: Treatment must be started within the first few days to be effective. If you have a fever, cough, or other symptoms, you might have COVID-19. Most people have mild illness and are able to recover at home. If you are sick: Keep track of your symptoms. If you have an emergency warning sign (including trouble breathing), call 911. Steps to help prevent the spread of COVID-19 if you are sick If you are sick with COVID-19 or think you might have COVID-19, follow the steps below to care for yourself and to help protect other people in your home and community. Stay home except to get medical care Stay home. Most people with COVID-19 have mild illness and can recover at home without medical care. Do not leave your home, except to get medical care. Do not visit public areas and do not go to places where you are unable to wear a mask. Take care of yourself. Get rest and stay hydrated. Take over-the-counter medicines, such as acetaminophen, to help you feel better. Stay in touch with your doctor. Call before you get medical care. Be sure to get care if you have trouble breathing, or have any other emergency warning signs, or if you think it is an emergency. Avoid public transportation, ride-sharing, or taxis if possible. Get tested If you have symptoms of COVID-19, get tested. While waiting for test results, stay away from others, including staying apart from those living in your household. Get tested as soon as possible after your symptoms start. Treatments may be available  for people with COVID-19 who are at risk for becoming very sick. Don't delay: Treatment must be started early to be effective--some treatments must begin within 5 days of your first symptoms. Contact your healthcare provider right away if your test result is positive to determine if you are eligible. Self-tests are one of several options for testing for the virus that causes COVID-19 and may be more convenient than laboratory-based tests and point-of-care tests. Ask your healthcare provider or your local health department if you need help interpreting your test results. You can visit your state, tribal, local, and territorial health department's website to look for the latest local information on testing sites. Separate yourself from other people As much as possible, stay in a specific room and away from other people and pets in your home. If possible, you should use  a separate bathroom. If you need to be around other people or animals in or outside of the home, wear a well-fitting mask. Tell your close contacts that they may have been exposed to COVID-19. An infected person can spread COVID-19 starting 48 hours (or 2 days) before the person has any symptoms or tests positive. By letting your close contacts know they may have been exposed to COVID-19, you are helping to protect everyone. See COVID-19 and Animals if you have questions about pets. If you are diagnosed with COVID-19, someone from the health department may call you. Answer the call to slow the spread. Monitor your symptoms Symptoms of COVID-19 include fever, cough, or other symptoms. Follow care instructions from your healthcare provider and local health department. Your local health authorities may give instructions on checking your symptoms and reporting information. When to seek emergency medical attention Look for emergency warning signs* for COVID-19. If someone is showing any of these signs, seek emergency medical care  immediately: Trouble breathing Persistent pain or pressure in the chest New confusion Inability to wake or stay awake Pale, gray, or blue-colored skin, lips, or nail beds, depending on skin tone *This list is not all possible symptoms. Please call your medical provider for any other symptoms that are severe or concerning to you. Call 911 or call ahead to your local emergency facility: Notify the operator that you are seeking care for someone who has or may have COVID-19. Call ahead before visiting your doctor Call ahead. Many medical visits for routine care are being postponed or done by phone or telemedicine. If you have a medical appointment that cannot be postponed, call your doctor's office, and tell them you have or may have COVID-19. This will help the office protect themselves and other patients. If you are sick, wear a well-fitting mask You should wear a mask if you must be around other people or animals, including pets (even at home). Wear a mask with the best fit, protection, and comfort for you. You don't need to wear the mask if you are alone. If you can't put on a mask (because of trouble breathing, for example), cover your coughs and sneezes in some other way. Try to stay at least 6 feet away from other people. This will help protect the people around you. Masks should not be placed on young children under age 67 years, anyone who has trouble breathing, or anyone who is not able to remove the mask without help. Cover your coughs and sneezes Cover your mouth and nose with a tissue when you cough or sneeze. Throw away used tissues in a lined trash can. Immediately wash your hands with soap and water for at least 20 seconds. If soap and water are not available, clean your hands with an alcohol-based hand sanitizer that contains at least 60% alcohol. Clean your hands often Wash your hands often with soap and water for at least 20 seconds. This is especially important after blowing your  nose, coughing, or sneezing; going to the bathroom; and before eating or preparing food. Use hand sanitizer if soap and water are not available. Use an alcohol-based hand sanitizer with at least 60% alcohol, covering all surfaces of your hands and rubbing them together until they feel dry. Soap and water are the best option, especially if hands are visibly dirty. Avoid touching your eyes, nose, and mouth with unwashed hands. Handwashing Tips Avoid sharing personal household items Do not share dishes, drinking glasses, cups, eating utensils, towels, or  bedding with other people in your home. Wash these items thoroughly after using them with soap and water or put in the dishwasher. Clean surfaces in your home regularly Clean and disinfect high-touch surfaces (for example, doorknobs, tables, handles, light switches, and countertops) in your "sick room" and bathroom. In shared spaces, you should clean and disinfect surfaces and items after each use by the person who is ill. If you are sick and cannot clean, a caregiver or other person should only clean and disinfect the area around you (such as your bedroom and bathroom) on an as needed basis. Your caregiver/other person should wait as long as possible (at least several hours) and wear a mask before entering, cleaning, and disinfecting shared spaces that you use. Clean and disinfect areas that may have blood, stool, or body fluids on them. Use household cleaners and disinfectants. Clean visible dirty surfaces with household cleaners containing soap or detergent. Then, use a household disinfectant. Use a product from [employer] List N: Disinfectants for Coronavirus (COVID-19). Be sure to follow the instructions on the label to ensure safe and effective use of the product. Many products recommend keeping the surface wet with a disinfectant for a certain period of time (look at "contact time" on the product label). You may also need to wear personal protective  equipment, such as gloves, depending on the directions on the product label. Immediately after disinfecting, wash your hands with soap and water for 20 seconds. For completed guidance on cleaning and disinfecting your home, visit Complete Disinfection Guidance. Take steps to improve ventilation at home Improve ventilation (air flow) at home to help prevent from spreading COVID-19 to other people in your household. Clear out COVID-19 virus particles in the air by opening windows, using air filters, and turning on fans in your home. Use this interactive tool to learn how to improve air flow in your home. When you can be around others after being sick with COVID-19 Deciding when you can be around others is different for different situations. Find out when you can safely end home isolation. For any additional questions about your care, contact your healthcare provider or state or local health department. 04/22/2020 Content source: [facility] for Immunization and Respiratory Diseases ([facility]), Division of Viral Diseases This information is not intended to replace advice given to you by your health care provider. Make sure you discuss any questions you have with your health care provider. Document Revised: 06/05/2020 Document Reviewed: 06/05/2020 Elsevier Patient Education  2022 [employer].     If you have been instructed to have an in-person evaluation today at a local Urgent Care facility, please use the link below. It will take you to a list of all of our available [facility], including address, phone number and hours of operation. Please do not delay care.  [facility]  If you or a family member do not have a primary care provider, use the link below to schedule a visit and establish care. When you choose a [facility] primary care physician or advanced practice provider, you gain a long-term partner in health. Find a Primary Care Provider  Learn more about  [facility]'s in-office and virtual care options: [facility] - Get Care Now

## 2023-06-06 NOTE — Telephone Encounter (Signed)
 Copied from CRM [id number]. Topic: General - Other >> Jun 06, 2023  2:31 PM Pamela Kim wrote: Reason for CRM: PATIENT HAS COVID  and is taking paxlovid  she wants to know if its ok for her to come into the office while taking the medication she would like a call back

## 2023-06-06 NOTE — Progress Notes (Signed)
 Virtual Visit Consent   Pamela Kim, you are scheduled for a virtual visit with a [facility] provider today. Just as with appointments in the office, your consent must be obtained to participate. Your consent will be active for this visit and any virtual visit you may have with one of our providers in the next 365 days. If you have a MyChart account, a copy of this consent can be sent to you electronically.  As this is a virtual visit, video technology does not allow for your provider to perform a traditional examination. This may limit your provider's ability to fully assess your condition. If your provider identifies any concerns that need to be evaluated in person or the need to arrange testing (such as labs, EKG, etc.), we will make arrangements to do so. Although advances in technology are sophisticated, we cannot ensure that it will always work on either your end or our end. If the connection with a video visit is poor, the visit may have to be switched to a telephone visit. With either a video or telephone visit, we are not always able to ensure that we have a secure connection.  By engaging in this virtual visit, you consent to the provision of healthcare and authorize for your insurance to be billed (if applicable) for the services provided during this visit. Depending on your insurance coverage, you may receive a charge related to this service.  I need to obtain your verbal consent now. Are you willing to proceed with your visit today? Pamela Kim has provided verbal consent on 06/06/2023 for a virtual visit (video or telephone). Angelia Kelp, PA-C  Date: 06/06/2023 12:47 PM   Virtual Visit via Video Note   IAngelia Kelp, connected with  Pamela Kim  ([id number], [birth year]) on 06/06/23 at 12:15 PM EDT by a video-enabled telemedicine application and verified that I am speaking with the correct person using two identifiers.  Location: Patient: Virtual  Visit Location Patient: Home Provider: Virtual Visit Location Provider: Home Office   I discussed the limitations of evaluation and management by telemedicine and the availability of in person appointments. The patient expressed understanding and agreed to proceed.    History of Present Illness: Pamela Kim is a 78 y.o. who identifies as a female who was assigned female at birth, and is being seen today for Covid 53.  HPI: URI  This is a new problem. The current episode started in the past 7 days (Symptoms started on 06/04/23; Had covid vaccine booster on 05/31/23). The problem has been gradually worsening. The maximum temperature recorded prior to her arrival was 100.4 - 100.9 F. The fever has been present for 1 to 2 days. Associated symptoms include congestion, coughing, headaches, rhinorrhea, sinus pain and a sore throat. Associated symptoms comments: Body aches. She has tried acetaminophen for the symptoms. The treatment provided mild relief.     Problems:  Patient Active Problem List   Diagnosis Date Noted   Fracture of 5th metatarsal 10/20/2011    Allergies:  Allergies  Allergen Reactions   Aspirin Hives   Keflex [Cephalexin] Hives   Neo-Synephrine [Phenylephrine Hcl (Pressors)] Other (See Comments)    Migraine headaches    Penicillins Hives   Tape     Scotch tape   Wal-Profen D Cold & Sinus [Pseudoephedrine-Ibuprofen] Other (See Comments)   Medications:  Current Outpatient Medications:    nirmatrelvir /ritonavir , renal dosing, (PAXLOVID ) 10 x 150 MG & 10 x 100MG  TABS, Take  2 tablets by mouth 2 (two) times daily for 5 days. (Take nirmatrelvir  150 mg one tablet twice daily for 5 days and ritonavir  100 mg one tablet twice daily for 5 days) Patient GFR is 46, Disp: 20 tablet, Rfl: 0   b complex vitamins capsule, Take 1 capsule by mouth daily., Disp: , Rfl:    Beta Carotene (VITAMIN A) 25000 UNIT capsule, Take 25,000 Units by mouth daily., Disp: , Rfl:    Cholecalciferol  (VITAMIN D3) 1.25 MG (50000 UT) CAPS, Take by mouth., Disp: , Rfl:    co-enzyme Q-10 30 MG capsule, Take 30 mg by mouth 3 (three) times daily., Disp: , Rfl:    losartan -hydrochlorothiazide (HYZAAR) 50-12.5 MG tablet, Take 1 tablet by mouth daily., Disp: 90 tablet, Rfl: 2   Omega-3 1000 MG CAPS, Take by mouth. Advance Omega, Disp: , Rfl:    ondansetron  (ZOFRAN ) 4 MG tablet, Take 1 tablet (4 mg total) by mouth every 8 (eight) hours as needed for nausea or vomiting., Disp: 20 tablet, Rfl: 0   simvastatin  (ZOCOR ) 10 MG tablet, Take 1 tablet (10 mg total) by mouth 2 (two) times a week., Disp: 45 tablet, Rfl: 3   thyroid  (ARMOUR THYROID ) 60 MG tablet, Take 1 tablet (60 mg total) by mouth daily before breakfast., Disp: 90 tablet, Rfl: 3   Vitamin C, Calcium Ascorbate, SOLR, Take by mouth., Disp: , Rfl:    VITAMIN K PO, Take 120 mcg by mouth daily., Disp: , Rfl:   Observations/Objective: Patient is well-developed, well-nourished in no acute distress.  Resting comfortably at home.  Head is normocephalic, atraumatic.  No labored breathing.  Speech is clear and coherent with logical content.  Patient is alert and oriented at baseline.    Assessment and Plan: 1. COVID-19 (Primary) - nirmatrelvir /ritonavir , renal dosing, (PAXLOVID ) 10 x 150 MG & 10 x 100MG  TABS; Take 2 tablets by mouth 2 (two) times daily for 5 days. (Take nirmatrelvir  150 mg one tablet twice daily for 5 days and ritonavir  100 mg one tablet twice daily for 5 days) Patient GFR is 46  Dispense: 20 tablet; Refill: 0  - Continue OTC symptomatic management of choice - Will send OTC vitamins and supplement information through AVS - Paxlovid  (renal dose) prescribed - Patient enrolled in MyChart symptom monitoring - Push fluids - Rest as needed - Discussed return precautions and when to seek in-person evaluation, sent via AVS as well   Follow Up Instructions: I discussed the assessment and treatment plan with the patient. The patient was  provided an opportunity to ask questions and all were answered. The patient agreed with the plan and demonstrated an understanding of the instructions.  A copy of instructions were sent to the patient via MyChart unless otherwise noted below.    The patient was advised to call back or seek an in-person evaluation if the symptoms worsen or if the condition fails to improve as anticipated.    Angelia Kelp, PA-C

## 2023-06-06 NOTE — Progress Notes (Signed)
   Thank you for the details you included in the comment boxes. Those details are very helpful in determining the best course of treatment for you and help us  to provide the best care. Because you are Covid positive, we recommend that you schedule a Virtual Urgent Care video visit in order for the provider to better assess what is going on.  The provider will be able to give you a more accurate diagnosis and treatment plan if we can more freely discuss your symptoms and with the addition of a virtual examination.   If you change your visit to a video visit, we will bill your insurance (similar to an office visit) and you will not be charged for this e-Visit. You will be able to stay at home and speak with the first available [facility] advanced practice provider. The link to do a video visit is in the drop down Menu tab of your Welcome screen in MyChart.      I have spent 5 minutes in review of e-visit questionnaire, review and updating patient chart, medical decision making and response to patient.   Angelia Kelp, PA-C

## 2023-06-07 NOTE — Telephone Encounter (Signed)
 Called pt multiple times to see what it is she is needing. However, no answer.

## 2023-06-09 ENCOUNTER — Ambulatory Visit: Admitting: Adult Health

## 2023-06-09 NOTE — Telephone Encounter (Signed)
 Called pt no answer. Looks like the appt. Got canceled so closing note.

## 2023-06-15 ENCOUNTER — Ambulatory Visit: Payer: Self-pay | Admitting: Adult Health

## 2023-06-15 ENCOUNTER — Encounter: Payer: Self-pay | Admitting: Adult Health

## 2023-06-15 DIAGNOSIS — T148XXA Other injury of unspecified body region, initial encounter: Secondary | ICD-10-CM

## 2023-06-15 NOTE — Telephone Encounter (Signed)
**Note De-identified  [facility] Obfuscation** Please advise 

## 2023-06-21 ENCOUNTER — Encounter: Payer: Self-pay | Admitting: Adult Health

## 2023-06-21 ENCOUNTER — Ambulatory Visit (INDEPENDENT_AMBULATORY_CARE_PROVIDER_SITE_OTHER): Admitting: Physician Assistant

## 2023-06-21 DIAGNOSIS — G8929 Other chronic pain: Secondary | ICD-10-CM | POA: Diagnosis not present

## 2023-06-21 DIAGNOSIS — M25512 Pain in left shoulder: Secondary | ICD-10-CM | POA: Diagnosis not present

## 2023-06-21 NOTE — Progress Notes (Signed)
 Office Visit Note   Patient: Pamela Kim           Date of Birth: Jun 10, 1945           MRN: [id number] Visit Date: 06/21/2023              Requested by: Alto Atta, NP [address] [place],  [place] [address] PCP: Alto Atta, NP   Assessment & Plan: Visit Diagnoses:  1. Chronic left shoulder pain     Plan: Impression is chronic left shoulder pain.  Given the patient's age and MRI findings of moderate size articular surface supraspinatus tear and moderate to advanced glenohumeral degenerative changes, discussed referral to Dr. Rozelle Corning or Dr. Hermina Loosen for surgical discussion versus referral for intra-articular cortisone injection.  She will follow-up with us  as needed.  Follow-Up Instructions: Return for f/u with Dr. Rozelle Corning or Dr. Hermina Loosen.   Orders:  No orders of the defined types were placed in this encounter.  No orders of the defined types were placed in this encounter.     Procedures: No procedures performed   Clinical Data: No additional findings.   Subjective: Chief Complaint  Patient presents with   Left Shoulder - Pain    HPI patient is a pleasant 78 year old female who comes in today with left shoulder pain.  She tells me this began back in 2001 after undergoing left hand surgery.  She tells me while the block was in place to the left upper extremity, her hand dropped causing her shoulder come out of place.  She then had a slow recovery following her hand surgery and was sent to extensive therapy where she had to pull a lot of weights using her shoulder.  Her symptoms have been intermittent for years but over the past few years they have worsened.  Last year her symptoms continue to progress and was sent for x-rays as well as physical therapy.  She notes that the PT made her pain worse.  She is here today for further evaluation treatment recommendation.  She is having pain throughout the entire shoulder.  Pain is worse when she is mowing the lawn  uphill through roots as well as when she is pushing up against the ground, walking or with external rotation of the shoulder.  She takes occasional Tylenol without significant relief.  She has not previously undergone cortisone injections to the left shoulder.  She denies any weakness to the left upper extremity.  She recently underwent MRI of the left shoulder which shows a articular surface supraspinatus tear, moderate AC degenerative changes, moderate to advanced glenohumeral degenerative changes and labral tearing.  Review of Systems as detailed in HPI.  All others reviewed and are negative.   Objective: Vital Signs: There were no vitals taken for this visit.  Physical Exam well-developed well-nourished female in no acute distress.  Alert and oriented x 3.  Ortho Exam left shoulder exam: Near full active range of motion in all planes.  She can internally rotate to T12.  She does have pain with empty can testing.  Mild pain with speeds and O'Brien's testing.  Mild pain with cross body adduction.  Negative bearhug.  She has full strength throughout.  She is neurovascularly intact distally.  Specialty Comments:  No specialty comments available.  Imaging: No new imaging   PMFS History: Patient Active Problem List   Diagnosis Date Noted   Fracture of 5th metatarsal 10/20/2011   Past Medical History:  Diagnosis Date   Hypertension  Hypothyroidism    Osteoporosis     Family History  Problem Relation Age of Onset   Asthma Mother    Mental illness Mother    Murrell Arrant' disease Mother    COPD Mother    Heart murmur Father    Learning disabilities Sister    Hypothyroidism Sister    Hypothyroidism Sister     Past Surgical History:  Procedure Laterality Date   CATARACT EXTRACTION, BILATERAL  2016   HAND SURGERY     Social History   Occupational History   Not on file  Tobacco Use   Smoking status: Never   Smokeless tobacco: Never  Vaping Use   Vaping status: Never Used   Substance and Sexual Activity   Alcohol use: Yes    Comment: Twice yearly   Drug use: Not on file   Sexual activity: Not on file

## 2023-06-22 ENCOUNTER — Other Ambulatory Visit: Payer: Self-pay | Admitting: Adult Health

## 2023-06-22 DIAGNOSIS — G8929 Other chronic pain: Secondary | ICD-10-CM

## 2023-06-23 ENCOUNTER — Ambulatory Visit

## 2023-06-23 ENCOUNTER — Ambulatory Visit (INDEPENDENT_AMBULATORY_CARE_PROVIDER_SITE_OTHER): Admitting: Adult Health

## 2023-06-23 ENCOUNTER — Encounter: Payer: Self-pay | Admitting: Adult Health

## 2023-06-23 VITALS — BP 110/80 | HR 88 | Temp 98.1°F | Ht 62.0 in | Wt 132.0 lb

## 2023-06-23 DIAGNOSIS — M19011 Primary osteoarthritis, right shoulder: Secondary | ICD-10-CM | POA: Diagnosis not present

## 2023-06-23 DIAGNOSIS — H6121 Impacted cerumen, right ear: Secondary | ICD-10-CM

## 2023-06-23 DIAGNOSIS — E782 Mixed hyperlipidemia: Secondary | ICD-10-CM

## 2023-06-23 DIAGNOSIS — M25511 Pain in right shoulder: Secondary | ICD-10-CM

## 2023-06-23 DIAGNOSIS — G8929 Other chronic pain: Secondary | ICD-10-CM

## 2023-06-23 DIAGNOSIS — E54 Ascorbic acid deficiency: Secondary | ICD-10-CM

## 2023-06-23 LAB — LIPID PANEL
Cholesterol: 248 mg/dL — ABNORMAL HIGH (ref 0–200)
HDL: 92.2 mg/dL (ref >39.00)
LDL Cholesterol: 141 mg/dL — ABNORMAL HIGH (ref 0–99)
NonHDL: 155.98
Total CHOL/HDL Ratio: 3
Triglycerides: 75 mg/dL (ref 0.0–149.0)
VLDL: 15 mg/dL (ref 0.0–40.0)

## 2023-06-23 LAB — HEPATIC FUNCTION PANEL
ALT: 13 U/L (ref 0–35)
AST: 20 U/L (ref 0–37)
Albumin: 4.8 g/dL (ref 3.5–5.2)
Alkaline Phosphatase: 64 U/L (ref 39–117)
Bilirubin, Direct: 0.1 mg/dL (ref 0.0–0.3)
Total Bilirubin: 0.5 mg/dL (ref 0.2–1.2)
Total Protein: 7.9 g/dL (ref 6.0–8.3)

## 2023-06-23 NOTE — Progress Notes (Signed)
 Subjective:    Patient ID: Pamela Kim, female    DOB: [birth year], 78 y.o.   MRN: [id number]  HPI 78 year old female who  has a past medical history of Hypertension, Hypothyroidism, and Osteoporosis.  She presents to the office today for follow-up regarding left shoulder pain.  She recently had an MRI done which showed IMPRESSION: 1. Significant supraspinatus and infraspinatus tendinopathy/tendinosis with a fairly deep articular surface tear involving the supraspinatus tendon anteriorly. Tear involves greater than 50% of the depth of the tendon. Probably at risk for full-thickness tear given its appearance. 2. Intact long head biceps tendon. 3. Moderate to advanced glenohumeral joint degenerative changes. 4. Labral degenerative changes with posteroinferior labral tear. 5. Elongated multi septated cystic lesion in the region of the rotator interval. This is most likely a ganglion cyst  She has been seen by orthopedics at [facility] but wanted a second opinion and she was referred to [facility].  She has not had this appointment yet.  He would like to have MRI of her right shoulder in order to compare the to since she does havechronic pain in her right shoulder as well.  She has good range of motion of the shoulders despite the findings of the MRI of the left shoulder.  Additionally, she believes that she needs cerumen removed from her right ear.  She has decreased hearing and feeling of ear fullness in her right ear.  She has been using Colace at home but has not been able to remove earwax.     Review of Systems See HPI   Past Medical History:  Diagnosis Date   Hypertension    Hypothyroidism    Osteoporosis     Social History   Socioeconomic History   Marital status: Married    Spouse name: Not on file   Number of children: Not on file   Years of education: Not on file   Highest education level: Bachelor's degree (e.g., BA, AB, BS)  Occupational  History   Not on file  Tobacco Use   Smoking status: Never   Smokeless tobacco: Never  Vaping Use   Vaping status: Never Used  Substance and Sexual Activity   Alcohol use: Yes    Comment: Twice yearly   Drug use: Not on file   Sexual activity: Not on file  Other Topics Concern   Not on file  Social History Narrative   Not on file   Social Drivers of Health   Financial Resource Strain: Patient Declined (05/08/2023)   Overall Financial Resource Strain (CARDIA)    Difficulty of Paying Living Expenses: Patient declined  Food Insecurity: Patient Declined (05/08/2023)   Hunger Vital Sign    Worried About Running Out of Food in the Last Year: Patient declined    Ran Out of Food in the Last Year: Patient declined  Transportation Needs: Patient Declined (05/08/2023)   PRAPARE - Administrator, Civil Service (Medical): Patient declined    Lack of Transportation (Non-Medical): Patient declined  Physical Activity: Sufficiently Active (05/08/2023)   Exercise Vital Sign    Days of Exercise per Week: 5 days    Minutes of Exercise per Session: 40 min  Stress: No Stress Concern Present (05/08/2023)   [employer] of Occupational Health - Occupational Stress Questionnaire    Feeling of Stress : Not at all  Social Connections: Unknown (05/08/2023)   Social Connection and Isolation Panel [NHANES]    Frequency of Communication with Friends  and Family: Patient declined    Frequency of Social Gatherings with Friends and Family: Patient declined    Attends Religious Services: Patient declined    Database administrator or Organizations: Patient declined    Attends Engineer, structural: Not on file    Marital Status: Patient declined  Intimate Partner Violence: Not on file    Past Surgical History:  Procedure Laterality Date   CATARACT EXTRACTION, BILATERAL  2016   HAND SURGERY      Family History  Problem Relation Age of Onset   Asthma Mother    Mental illness Mother     Murrell Arrant' disease Mother    COPD Mother    Heart murmur Father    Learning disabilities Sister    Hypothyroidism Sister    Hypothyroidism Sister     Allergies  Allergen Reactions   Aspirin Hives   Keflex [Cephalexin] Hives   Neo-Synephrine [Phenylephrine Hcl (Pressors)] Other (See Comments)    Migraine headaches    Penicillins Hives   Tape     Scotch tape   Wal-Profen D Cold & Sinus [Pseudoephedrine-Ibuprofen] Other (See Comments)    Current Outpatient Medications on File Prior to Visit  Medication Sig Dispense Refill   b complex vitamins capsule Take 1 capsule by mouth daily.     Beta Carotene (VITAMIN A) 25000 UNIT capsule Take 25,000 Units by mouth daily.     Cholecalciferol (VITAMIN D3) 1.25 MG (50000 UT) CAPS Take by mouth.     co-enzyme Q-10 30 MG capsule Take 30 mg by mouth 3 (three) times daily.     losartan -hydrochlorothiazide (HYZAAR) 50-12.5 MG tablet Take 1 tablet by mouth daily. 90 tablet 2   Omega-3 1000 MG CAPS Take by mouth. Advance Omega     ondansetron  (ZOFRAN ) 4 MG tablet Take 1 tablet (4 mg total) by mouth every 8 (eight) hours as needed for nausea or vomiting. 20 tablet 0   simvastatin  (ZOCOR ) 10 MG tablet Take 1 tablet (10 mg total) by mouth 2 (two) times a week. 45 tablet 3   thyroid  (ARMOUR THYROID ) 60 MG tablet Take 1 tablet (60 mg total) by mouth daily before breakfast. 90 tablet 3   Vitamin C, Calcium Ascorbate, SOLR Take by mouth.     VITAMIN K PO Take 120 mcg by mouth daily.     No current facility-administered medications on file prior to visit.    BP 110/80   Pulse 88   Temp 98.1 F (36.7 C) (Oral)   Ht 5\' 2"  (1.575 m)   Wt 132 lb (59.9 kg)   SpO2 97%   BMI 24.14 kg/m       Objective:   Physical Exam HENT:     Right Ear: There is impacted cerumen.  Musculoskeletal:        General: Tenderness present. Normal range of motion.     Right shoulder: Bony tenderness and crepitus present. No swelling or deformity. Normal range of motion.  Normal strength.  Skin:    General: Skin is warm and dry.  Neurological:     General: No focal deficit present.     Mental Status: She is oriented to person, place, and time.  Psychiatric:        Mood and Affect: Mood normal.        Behavior: Behavior normal.        Thought Content: Thought content normal.        Judgment: Judgment normal.  Assessment & Plan:  1. Chronic right shoulder pain (Primary) - Will order Xray of right shoulder and depending on results may need to order MRI  - DG Shoulder Right; Future  2. Impacted cerumen of right ear Ear Cerumen Removal  Date/Time: 06/23/2023 10:29 AM  Performed by: Alto Atta, NP Authorized by: Alto Atta, NP   Anesthesia: Local Anesthetic: none Location details: right ear Patient tolerance: patient tolerated the procedure well with no immediate complications Comments: Reported improvement in hearing once cerumen was removed  Procedure type: irrigation  Sedation: Patient sedated: no

## 2023-06-24 ENCOUNTER — Encounter: Payer: Self-pay | Admitting: Adult Health

## 2023-06-27 LAB — VITAMIN C: Vitamin C: 0.9 mg/dL (ref 0.3–2.7)

## 2023-06-28 ENCOUNTER — Encounter: Payer: Self-pay | Admitting: Adult Health

## 2023-06-28 ENCOUNTER — Other Ambulatory Visit: Payer: Self-pay | Admitting: Adult Health

## 2023-06-28 DIAGNOSIS — E782 Mixed hyperlipidemia: Secondary | ICD-10-CM

## 2023-06-28 MED ORDER — SIMVASTATIN 10 MG PO TABS
10.0000 mg | ORAL_TABLET | Freq: Every day | ORAL | 3 refills | Status: DC
Start: 2023-06-28 — End: 2023-12-20

## 2023-06-29 ENCOUNTER — Encounter: Payer: Self-pay | Admitting: Adult Health

## 2023-06-29 NOTE — Telephone Encounter (Signed)
 Please advise ok to schedule pt here for shot?

## 2023-07-05 ENCOUNTER — Ambulatory Visit: Admitting: Adult Health

## 2023-07-05 ENCOUNTER — Ambulatory Visit: Payer: Self-pay | Admitting: Adult Health

## 2023-07-05 ENCOUNTER — Other Ambulatory Visit: Payer: Self-pay | Admitting: Adult Health

## 2023-07-05 DIAGNOSIS — G8929 Other chronic pain: Secondary | ICD-10-CM

## 2023-07-07 ENCOUNTER — Encounter: Admitting: Orthopedic Surgery

## 2023-07-07 ENCOUNTER — Ambulatory Visit: Admitting: Adult Health

## 2023-07-09 ENCOUNTER — Ambulatory Visit
Admission: RE | Admit: 2023-07-09 | Discharge: 2023-07-09 | Disposition: A | Discharge status: Home / Self Care | Source: Ambulatory Visit | Attending: Adult Health | Admitting: Adult Health

## 2023-07-09 DIAGNOSIS — G8929 Other chronic pain: Secondary | ICD-10-CM | POA: Diagnosis not present

## 2023-07-09 DIAGNOSIS — M7581 Other shoulder lesions, right shoulder: Secondary | ICD-10-CM | POA: Diagnosis not present

## 2023-07-09 DIAGNOSIS — M25511 Pain in right shoulder: Secondary | ICD-10-CM | POA: Diagnosis not present

## 2023-07-09 DIAGNOSIS — M19011 Primary osteoarthritis, right shoulder: Secondary | ICD-10-CM | POA: Diagnosis not present

## 2023-07-10 ENCOUNTER — Other Ambulatory Visit: Payer: Self-pay | Admitting: Adult Health

## 2023-07-19 ENCOUNTER — Ambulatory Visit: Payer: Self-pay | Admitting: Adult Health

## 2023-07-25 DIAGNOSIS — M25511 Pain in right shoulder: Secondary | ICD-10-CM | POA: Diagnosis not present

## 2023-07-25 DIAGNOSIS — M25512 Pain in left shoulder: Secondary | ICD-10-CM | POA: Diagnosis not present

## 2023-08-09 ENCOUNTER — Ambulatory Visit: Payer: Self-pay | Admitting: Adult Health

## 2023-08-09 ENCOUNTER — Ambulatory Visit (INDEPENDENT_AMBULATORY_CARE_PROVIDER_SITE_OTHER): Payer: PPO | Admitting: Adult Health

## 2023-08-09 ENCOUNTER — Encounter: Payer: Self-pay | Admitting: Adult Health

## 2023-08-09 VITALS — BP 110/80 | HR 72 | Temp 98.1°F | Ht 62.0 in | Wt 133.0 lb

## 2023-08-09 DIAGNOSIS — E039 Hypothyroidism, unspecified: Secondary | ICD-10-CM

## 2023-08-09 DIAGNOSIS — E785 Hyperlipidemia, unspecified: Secondary | ICD-10-CM

## 2023-08-09 DIAGNOSIS — M810 Age-related osteoporosis without current pathological fracture: Secondary | ICD-10-CM | POA: Diagnosis not present

## 2023-08-09 DIAGNOSIS — Z0000 Encounter for general adult medical examination without abnormal findings: Secondary | ICD-10-CM

## 2023-08-09 DIAGNOSIS — I10 Essential (primary) hypertension: Secondary | ICD-10-CM | POA: Diagnosis not present

## 2023-08-09 LAB — CBC WITH DIFFERENTIAL/PLATELET
Basophils Absolute: 0 K/uL (ref 0.0–0.1)
Basophils Relative: 0.2 % (ref 0.0–3.0)
Eosinophils Absolute: 0.1 K/uL (ref 0.0–0.7)
Eosinophils Relative: 1.8 % (ref 0.0–5.0)
HCT: 33.3 % — ABNORMAL LOW (ref 36.0–46.0)
Hemoglobin: 11.4 g/dL — ABNORMAL LOW (ref 12.0–15.0)
Lymphocytes Relative: 14.8 % (ref 12.0–46.0)
Lymphs Abs: 1.1 K/uL (ref 0.7–4.0)
MCHC: 34.1 g/dL (ref 30.0–36.0)
MCV: 91.3 fl (ref 78.0–100.0)
Monocytes Absolute: 0.6 K/uL (ref 0.1–1.0)
Monocytes Relative: 8 % (ref 3.0–12.0)
Neutro Abs: 5.8 K/uL (ref 1.4–7.7)
Neutrophils Relative %: 75.2 % (ref 43.0–77.0)
Platelets: 235 K/uL (ref 150.0–400.0)
RBC: 3.65 Mil/uL — ABNORMAL LOW (ref 3.87–5.11)
RDW: 13.7 % (ref 11.5–15.5)
WBC: 7.7 K/uL (ref 4.0–10.5)

## 2023-08-09 LAB — LIPID PANEL
Cholesterol: 208 mg/dL — ABNORMAL HIGH (ref 0–200)
HDL: 91.3 mg/dL (ref >39.00)
LDL Cholesterol: 106 mg/dL — ABNORMAL HIGH (ref 0–99)
NonHDL: 116.77
Total CHOL/HDL Ratio: 2
Triglycerides: 56 mg/dL (ref 0.0–149.0)
VLDL: 11.2 mg/dL (ref 0.0–40.0)

## 2023-08-09 LAB — COMPREHENSIVE METABOLIC PANEL WITH GFR
ALT: 14 U/L (ref 0–35)
AST: 21 U/L (ref 0–37)
Albumin: 4.5 g/dL (ref 3.5–5.2)
Alkaline Phosphatase: 59 U/L (ref 39–117)
BUN: 22 mg/dL (ref 6–23)
CO2: 31 meq/L (ref 19–32)
Calcium: 9.7 mg/dL (ref 8.4–10.5)
Chloride: 99 meq/L (ref 96–112)
Creatinine, Ser: 1.13 mg/dL (ref 0.40–1.20)
GFR: 47.38 mL/min — ABNORMAL LOW (ref >60.00)
Glucose, Bld: 91 mg/dL (ref 70–99)
Potassium: 4.8 meq/L (ref 3.5–5.1)
Sodium: 138 meq/L (ref 135–145)
Total Bilirubin: 0.5 mg/dL (ref 0.2–1.2)
Total Protein: 7.1 g/dL (ref 6.0–8.3)

## 2023-08-09 LAB — TSH: TSH: 2.76 u[IU]/mL (ref 0.35–5.50)

## 2023-08-09 LAB — VITAMIN D 25 HYDROXY (VIT D DEFICIENCY, FRACTURES): VITD: 25.27 ng/mL — ABNORMAL LOW (ref 30.00–100.00)

## 2023-08-09 NOTE — Progress Notes (Signed)
 Subjective:    Patient ID: Pamela Kim, female    DOB: 06/06/1945, 78 y.o.   MRN: [id number]  HPI Patient presents for yearly preventative medicine examination. She is a 78 year old female who  has a past medical history of Hypertension, Hypothyroidism, and Osteoporosis.  Essential hypertension-prescribed Hyzaar 50-12.5 mg daily. She does not check her blood pressure at home. Denies symptoms of high blood pressure.  BP Readings from Last 3 Encounters:  08/09/23 110/80  06/23/23 110/80  05/12/23 130/80   Hypothyroidism-currently prescribed Armour Thyroid  60 mg every morning.  She has tried Synthroid in the past on multiple occasions and reports this Synthroid caused her to have stiffness in her muscles and fatigue. Lab Results  Component Value Date   TSH 4.14 08/04/2022   Hyperlipidemia - managed with Simvastatin  10 mg daily. She denies myalgia or fatigue  Lab Results  Component Value Date   CHOL 248 (H) 06/23/2023   HDL 92.20 06/23/2023   LDLCALC 141 (H) 06/23/2023   TRIG 75.0 06/23/2023   CHOLHDL 3 06/23/2023   Osteoporosis -bone density screen in July 2023 showed T-score of -2.7.  She tried Fosmax in the past and had diarrhea. Prolia was approved but never started.   All immunizations and health maintenance protocols were reviewed with the patient and needed orders were placed.  Appropriate screening laboratory values were ordered for the patient including screening of hyperlipidemia, renal function and hepatic function.  Medication reconciliation,  past medical history, social history, problem list and allergies were reviewed in detail with the patient  Goals were established with regard to weight loss, exercise, and  diet in compliance with medications. She is back to walking although it causes pain in her shoulders.    Review of Systems  Constitutional: Negative.   HENT: Negative.    Eyes: Negative.   Respiratory: Negative.    Cardiovascular: Negative.    Gastrointestinal: Negative.   Endocrine: Negative.   Genitourinary: Negative.   Musculoskeletal:  Positive for arthralgias (bilateral shoulders).  Skin: Negative.   Allergic/Immunologic: Negative.   Neurological: Negative.   Hematological: Negative.   Psychiatric/Behavioral: Negative.     Past Medical History:  Diagnosis Date   Hypertension    Hypothyroidism    Osteoporosis     Social History   Socioeconomic History   Marital status: Married    Spouse name: Not on file   Number of children: Not on file   Years of education: Not on file   Highest education level: Bachelor's degree (e.g., BA, AB, BS)  Occupational History   Not on file  Tobacco Use   Smoking status: Never   Smokeless tobacco: Never  Vaping Use   Vaping status: Never Used  Substance and Sexual Activity   Alcohol use: Yes    Comment: Twice yearly   Drug use: Not on file   Sexual activity: Not on file  Other Topics Concern   Not on file  Social History Narrative   Not on file   Social Drivers of Health   Financial Resource Strain: Patient Declined (05/08/2023)   Overall Financial Resource Strain (CARDIA)    Difficulty of Paying Living Expenses: Patient declined  Food Insecurity: Patient Declined (05/08/2023)   Hunger Vital Sign    Worried About Running Out of Food in the Last Year: Patient declined    Ran Out of Food in the Last Year: Patient declined  Transportation Needs: Patient Declined (05/08/2023)   PRAPARE - Transportation    Lack  of Transportation (Medical): Patient declined    Lack of Transportation (Non-Medical): Patient declined  Physical Activity: Sufficiently Active (05/08/2023)   Exercise Vital Sign    Days of Exercise per Week: 5 days    Minutes of Exercise per Session: 40 min  Stress: No Stress Concern Present (05/08/2023)   [employer] of Occupational Health - Occupational Stress Questionnaire    Feeling of Stress : Not at all  Social Connections: Unknown (05/08/2023)   Social  Connection and Isolation Panel    Frequency of Communication with Friends and Family: Patient declined    Frequency of Social Gatherings with Friends and Family: Patient declined    Attends Religious Services: Patient declined    Database administrator or Organizations: Patient declined    Attends Engineer, structural: Not on file    Marital Status: Patient declined  Intimate Partner Violence: Not on file    Past Surgical History:  Procedure Laterality Date   CATARACT EXTRACTION, BILATERAL  2016   HAND SURGERY      Family History  Problem Relation Age of Onset   Asthma Mother    Mental illness Mother    Yvone' disease Mother    COPD Mother    Heart murmur Father    Learning disabilities Sister    Hypothyroidism Sister    Hypothyroidism Sister     Allergies  Allergen Reactions   Aspirin Hives   Gabapentin Other (See Comments)    gabapentin   Keflex [Cephalexin] Hives   Neo-Synephrine [Phenylephrine Hcl (Pressors)] Other (See Comments)    Migraine headaches    Penicillins Hives   Tape     Scotch tape   Wal-Profen D Cold & Sinus [Pseudoephedrine-Ibuprofen] Other (See Comments)    Current Outpatient Medications on File Prior to Visit  Medication Sig Dispense Refill   ARMOUR THYROID  60 MG tablet TAKE 1 TABLET (60 MG TOTAL) BY MOUTH DAILY BEFORE BREAKFAST. 90 tablet 1   b complex vitamins capsule Take 1 capsule by mouth daily.     Beta Carotene (VITAMIN A) 25000 UNIT capsule Take 25,000 Units by mouth daily.     Cholecalciferol (VITAMIN D3) 1.25 MG (50000 UT) CAPS Take by mouth.     co-enzyme Q-10 30 MG capsule Take 30 mg by mouth 3 (three) times daily.     losartan -hydrochlorothiazide (HYZAAR) 50-12.5 MG tablet Take 1 tablet by mouth daily. 90 tablet 2   Omega-3 1000 MG CAPS Take by mouth. Advance Omega     ondansetron  (ZOFRAN ) 4 MG tablet Take 1 tablet (4 mg total) by mouth every 8 (eight) hours as needed for nausea or vomiting. 20 tablet 0   simvastatin   (ZOCOR ) 10 MG tablet Take 1 tablet (10 mg total) by mouth daily at 6 PM. 90 tablet 3   Vitamin C, Calcium Ascorbate, SOLR Take by mouth.     VITAMIN K PO Take 120 mcg by mouth daily.     No current facility-administered medications on file prior to visit.    BP 110/80   Pulse 72   Temp 98.1 F (36.7 C) (Oral)   Ht 5' 2 (1.575 m)   Wt 133 lb (60.3 kg)   SpO2 99%   BMI 24.33 kg/m       Objective:   Physical Exam Vitals and nursing note reviewed.  Constitutional:      General: She is not in acute distress.    Appearance: Normal appearance. She is not ill-appearing.  HENT:  Head: Normocephalic and atraumatic.     Right Ear: Tympanic membrane, ear canal and external ear normal. There is no impacted cerumen.     Left Ear: Tympanic membrane, ear canal and external ear normal. There is no impacted cerumen.     Nose: Nose normal. No congestion or rhinorrhea.     Mouth/Throat:     Mouth: Mucous membranes are moist.     Pharynx: Oropharynx is clear.  Eyes:     Extraocular Movements: Extraocular movements intact.     Conjunctiva/sclera: Conjunctivae normal.     Pupils: Pupils are equal, round, and reactive to light.  Neck:     Vascular: No carotid bruit.  Cardiovascular:     Rate and Rhythm: Normal rate and regular rhythm.     Pulses: Normal pulses.     Heart sounds: No murmur heard.    No friction rub. No gallop.  Pulmonary:     Effort: Pulmonary effort is normal.     Breath sounds: Normal breath sounds.  Abdominal:     General: Abdomen is flat. Bowel sounds are normal. There is no distension.     Palpations: Abdomen is soft. There is no mass.     Tenderness: There is no abdominal tenderness. There is no guarding or rebound.     Hernia: No hernia is present.  Musculoskeletal:     Right shoulder: Bony tenderness present. Decreased range of motion. Decreased strength.     Left shoulder: Bony tenderness present. Decreased range of motion. Decreased strength.     Cervical  back: Normal range of motion and neck supple.  Lymphadenopathy:     Cervical: No cervical adenopathy.  Skin:    General: Skin is warm and dry.     Capillary Refill: Capillary refill takes less than 2 seconds.  Neurological:     General: No focal deficit present.     Mental Status: She is alert and oriented to person, place, and time.  Psychiatric:        Mood and Affect: Mood normal.        Behavior: Behavior normal.        Thought Content: Thought content normal.        Judgment: Judgment normal.        Assessment & Plan:  1. Routine general medical examination at a health care facility (Primary) Today patient counseled on age appropriate routine health concerns for screening and prevention, each reviewed and up to date or declined. Immunizations reviewed and up to date or declined. Labs ordered and reviewed. Risk factors for depression reviewed and negative. Hearing function and visual acuity are intact. ADLs screened and addressed as needed. Functional ability and level of safety reviewed and appropriate. Education, counseling and referrals performed based on assessed risks today. Patient provided with a copy of personalized plan for preventive services.   2. Essential hypertension - Well controlled. No change in medication  - CBC with Differential/Platelet; Future - Comprehensive metabolic panel with GFR; Future - Lipid panel; Future - TSH; Future  3. Acquired hypothyroidism -Consider dose change of armour thyroid   - CBC with Differential/Platelet; Future - Comprehensive metabolic panel with GFR; Future - Lipid panel; Future - TSH; Future  4. Hyperlipidemia, unspecified hyperlipidemia type - Consider dose change of synthroid  - CBC with Differential/Platelet; Future - Comprehensive metabolic panel with GFR; Future - Lipid panel; Future - TSH; Future  5. Age-related osteoporosis without current pathological fracture - Will repeat Dexa Scan  - Likely refer to  osteoporosis clinic  - CBC with Differential/Platelet; Future - Comprehensive metabolic panel with GFR; Future - Lipid panel; Future - TSH; Future - DG Bone Density; Future - VITAMIN D  25 Hydroxy (Vit-D Deficiency, Fractures); Future  Jaydalyn Demattia

## 2023-08-10 NOTE — Telephone Encounter (Signed)
 FYI

## 2023-08-31 DIAGNOSIS — M25511 Pain in right shoulder: Secondary | ICD-10-CM | POA: Diagnosis not present

## 2023-09-28 DIAGNOSIS — H31003 Unspecified chorioretinal scars, bilateral: Secondary | ICD-10-CM | POA: Diagnosis not present

## 2023-09-28 DIAGNOSIS — H52203 Unspecified astigmatism, bilateral: Secondary | ICD-10-CM | POA: Diagnosis not present

## 2023-09-28 DIAGNOSIS — H5203 Hypermetropia, bilateral: Secondary | ICD-10-CM | POA: Diagnosis not present

## 2023-10-12 ENCOUNTER — Encounter: Payer: Self-pay | Admitting: Adult Health

## 2023-10-12 ENCOUNTER — Ambulatory Visit (HOSPITAL_BASED_OUTPATIENT_CLINIC_OR_DEPARTMENT_OTHER)
Admission: RE | Admit: 2023-10-12 | Discharge: 2023-10-12 | Disposition: A | Discharge status: Home / Self Care | Source: Ambulatory Visit | Attending: Adult Health | Admitting: Adult Health

## 2023-10-12 ENCOUNTER — Telehealth: Payer: Self-pay | Admitting: *Deleted

## 2023-10-12 ENCOUNTER — Ambulatory Visit (INDEPENDENT_AMBULATORY_CARE_PROVIDER_SITE_OTHER): Admitting: Adult Health

## 2023-10-12 VITALS — BP 120/80 | HR 86 | Temp 98.1°F | Ht 62.0 in | Wt 129.0 lb

## 2023-10-12 DIAGNOSIS — G8929 Other chronic pain: Secondary | ICD-10-CM

## 2023-10-12 DIAGNOSIS — M25512 Pain in left shoulder: Secondary | ICD-10-CM

## 2023-10-12 DIAGNOSIS — M25511 Pain in right shoulder: Secondary | ICD-10-CM | POA: Diagnosis not present

## 2023-10-12 DIAGNOSIS — Z23 Encounter for immunization: Secondary | ICD-10-CM

## 2023-10-12 DIAGNOSIS — M810 Age-related osteoporosis without current pathological fracture: Secondary | ICD-10-CM | POA: Insufficient documentation

## 2023-10-12 DIAGNOSIS — Z780 Asymptomatic menopausal state: Secondary | ICD-10-CM | POA: Diagnosis not present

## 2023-10-12 MED ORDER — COVID-19 MRNA VACC (MODERNA) 50 MCG/0.5ML IM SUSP: 0.5000 mL | Freq: Once | INTRAMUSCULAR | 0 refills | Status: AC

## 2023-10-12 MED ORDER — COVID-19 MRNA VACC (MODERNA) 50 MCG/0.5ML IM SUSP: 0.5000 mL | Freq: Once | INTRAMUSCULAR | 0 refills | Status: DC

## 2023-10-12 NOTE — Telephone Encounter (Signed)
 Copied from CRM [id number]. Topic: Clinical - Prescription Issue >> Oct 12, 2023  1:29 PM Burnard DEL wrote: Reason for CRM: Patient would like to know if COVID-19 mRNA vaccine, Moderna, >/= 17yrs, (SPIKEVAX) injection could be resent to   [id number] [address], [place] - [address]  Phone: [phone] Fax: [phone]  ,it is closer to their home.

## 2023-10-12 NOTE — Telephone Encounter (Signed)
 Per pt at visit the Rx need to go to [address] location. Per other phone note prescription did not go through to pharmacy per pt.  Called pt to advised that Rx went through with confirmation on our end. Pt stated that she know that it went through but she want the Rx to go to another location she changed her mind about where she wants it to go. I advised pt that I will resend the prescription for one more time but no more after. Pt verbalized understanding.

## 2023-10-12 NOTE — Progress Notes (Signed)
 Subjective:    Patient ID: Pamela Kim, female    DOB: 18-Sep-1945, 78 y.o.   MRN: [id number]  HPI  78 year old female being evaluated today for shoulder pain.  She does see Dr. Melita at [facility] for rotator cuff arthropathic issues.  She and her husband just bought a house in [place] so that they can be closer to her children.  And plans on establishing with a new orthopedic doctor there.  She is hoping to have surgery in November and is curious if I have any thoughts on who she should see either at [facility] or [facility].  She would also like to have prescription for COVID 19 vaccination   Review of Systems See HPI   Past Medical History:  Diagnosis Date   Hypertension    Hypothyroidism    Osteoporosis     Social History   Socioeconomic History   Marital status: Married    Spouse name: Not on file   Number of children: Not on file   Years of education: Not on file   Highest education level: Bachelor's degree (e.g., BA, AB, BS)  Occupational History   Not on file  Tobacco Use   Smoking status: Never   Smokeless tobacco: Never  Vaping Use   Vaping status: Never Used  Substance and Sexual Activity   Alcohol use: Yes    Comment: Twice yearly   Drug use: Not on file   Sexual activity: Not on file  Other Topics Concern   Not on file  Social History Narrative   Not on file   Social Drivers of Health   Financial Resource Strain: Patient Declined (05/08/2023)   Overall Financial Resource Strain (CARDIA)    Difficulty of Paying Living Expenses: Patient declined  Food Insecurity: Patient Declined (05/08/2023)   Hunger Vital Sign    Worried About Running Out of Food in the Last Year: Patient declined    Ran Out of Food in the Last Year: Patient declined  Transportation Needs: Patient Declined (05/08/2023)   PRAPARE - Administrator, Civil Service (Medical): Patient declined    Lack of Transportation (Non-Medical): Patient declined  Physical  Activity: Sufficiently Active (05/08/2023)   Exercise Vital Sign    Days of Exercise per Week: 5 days    Minutes of Exercise per Session: 40 min  Stress: No Stress Concern Present (05/08/2023)   [employer] of Occupational Health - Occupational Stress Questionnaire    Feeling of Stress : Not at all  Social Connections: Unknown (05/08/2023)   Social Connection and Isolation Panel    Frequency of Communication with Friends and Family: Patient declined    Frequency of Social Gatherings with Friends and Family: Patient declined    Attends Religious Services: Patient declined    Database administrator or Organizations: Patient declined    Attends Banker Meetings: Not on file    Marital Status: Patient declined  Intimate Partner Violence: Not on file    Past Surgical History:  Procedure Laterality Date   CATARACT EXTRACTION, BILATERAL  2016   HAND SURGERY      Family History  Problem Relation Age of Onset   Asthma Mother    Mental illness Mother    Yvone' disease Mother    COPD Mother    Heart murmur Father    Learning disabilities Sister    Hypothyroidism Sister    Hypothyroidism Sister     Allergies  Allergen Reactions   Aspirin  Hives   Gabapentin Other (See Comments)    gabapentin   Keflex [Cephalexin] Hives   Neo-Synephrine [Phenylephrine Hcl (Pressors)] Other (See Comments)    Migraine headaches    Penicillins Hives   Tape     Scotch tape   Wal-Profen D Cold & Sinus [Pseudoephedrine-Ibuprofen] Other (See Comments)    Current Outpatient Medications on File Prior to Visit  Medication Sig Dispense Refill   ARMOUR THYROID  60 MG tablet TAKE 1 TABLET (60 MG TOTAL) BY MOUTH DAILY BEFORE BREAKFAST. 90 tablet 1   b complex vitamins capsule Take 1 capsule by mouth daily.     Beta Carotene (VITAMIN A) 25000 UNIT capsule Take 25,000 Units by mouth daily.     Cholecalciferol (VITAMIN D3) 1.25 MG (50000 UT) CAPS Take by mouth.     co-enzyme Q-10 30 MG capsule  Take 30 mg by mouth 3 (three) times daily.     losartan -hydrochlorothiazide (HYZAAR) 50-12.5 MG tablet Take 1 tablet by mouth daily. 90 tablet 2   Omega-3 1000 MG CAPS Take by mouth. Advance Omega     ondansetron  (ZOFRAN ) 4 MG tablet Take 1 tablet (4 mg total) by mouth every 8 (eight) hours as needed for nausea or vomiting. 20 tablet 0   simvastatin  (ZOCOR ) 10 MG tablet Take 1 tablet (10 mg total) by mouth daily at 6 PM. 90 tablet 3   Vitamin C, Calcium Ascorbate, SOLR Take by mouth.     VITAMIN K PO Take 120 mcg by mouth daily.     No current facility-administered medications on file prior to visit.    BP 120/80   Pulse 86   Temp 98.1 F (36.7 C) (Oral)   Ht 5' 2 (1.575 m)   Wt 129 lb (58.5 kg)   SpO2 98%   BMI 23.59 kg/m       Objective:   Physical Exam Vitals and nursing note reviewed.  Constitutional:      Appearance: Normal appearance.  Musculoskeletal:        General: Normal range of motion.  Skin:    General: Skin is warm and dry.  Neurological:     General: No focal deficit present.     Mental Status: She is alert and oriented to person, place, and time.  Psychiatric:        Mood and Affect: Mood normal.        Behavior: Behavior normal.        Thought Content: Thought content normal.           Assessment & Plan:  1. Chronic pain of both shoulders (Primary) - She will have acute orthopedic clinic close to her when she is.  I encouraged her to get on their website and schedule with a joint replacement specialist which she will do.  2. Need for COVID-19 vaccine  - COVID-19 mRNA vaccine, Moderna, >/= 94yrs, (SPIKEVAX) injection; Inject 0.5 mLs into the muscle once for 1 dose.  Dispense: 0.5 mL; Refill: 0  Darleene Shape, NP

## 2023-10-12 NOTE — Telephone Encounter (Signed)
 FYI

## 2023-10-14 ENCOUNTER — Other Ambulatory Visit: Payer: Self-pay | Admitting: Adult Health

## 2023-10-14 ENCOUNTER — Encounter: Payer: Self-pay | Admitting: Adult Health

## 2023-10-14 ENCOUNTER — Telehealth: Payer: Self-pay

## 2023-10-14 ENCOUNTER — Encounter: Payer: Self-pay | Admitting: Physician Assistant

## 2023-10-14 ENCOUNTER — Telehealth: Admitting: Physician Assistant

## 2023-10-14 DIAGNOSIS — Z23 Encounter for immunization: Secondary | ICD-10-CM

## 2023-10-14 MED ORDER — COVID-19 MRNA VAC-TRIS(PFIZER) 30 MCG/0.3ML IM SUSY: 0.3000 mL | PREFILLED_SYRINGE | Freq: Once | INTRAMUSCULAR | 0 refills | Status: AC

## 2023-10-14 NOTE — Telephone Encounter (Signed)
 Copied from CRM [id number]. Topic: Clinical - Prescription Issue >> Oct 14, 2023  9:36 AM Rea ORN wrote: Reason for CRM: Pt called to advise PCP that [facility] not [facility] Moderna vaccine only [employer]. She is asking that [facility] [address] for [employer].  Pt also stated the PCP should send over both Covid vaccine prescriptions to pharmacy's going forward since they will not change it. Pt stated she wanted to get this done today and was in the parking lot. I advised her that PCP is in clinic and I can not guarantee this will be switched today.

## 2023-10-14 NOTE — Progress Notes (Signed)
 Virtual Visit Consent   Pamela Kim, you are scheduled for a virtual visit with a [facility] provider today. Just as with appointments in the office, your consent must be obtained to participate. Your consent will be active for this visit and any virtual visit you may have with one of our providers in the next 365 days. If you have a MyChart account, a copy of this consent can be sent to you electronically.  As this is a virtual visit, video technology does not allow for your provider to perform a traditional examination. This may limit your provider's ability to fully assess your condition. If your provider identifies any concerns that need to be evaluated in person or the need to arrange testing (such as labs, EKG, etc.), we will make arrangements to do so. Although advances in technology are sophisticated, we cannot ensure that it will always work on either your end or our end. If the connection with a video visit is poor, the visit may have to be switched to a telephone visit. With either a video or telephone visit, we are not always able to ensure that we have a secure connection.  By engaging in this virtual visit, you consent to the provision of healthcare and authorize for your insurance to be billed (if applicable) for the services provided during this visit. Depending on your insurance coverage, you may receive a charge related to this service.  I need to obtain your verbal consent now. Are you willing to proceed with your visit today? Pamela Kim has provided verbal consent on 10/14/2023 for a virtual visit (video or telephone). Delon CHRISTELLA Dickinson, PA-C  Date: 10/14/2023 4:08 PM   Virtual Visit via Video Note   I, Delon CHRISTELLA Dickinson, connected with  Pamela Kim  ([id number], 09-30-1945) on 10/14/23 at  4:00 PM EDT by a video-enabled telemedicine application and verified that I am speaking with the correct person using two identifiers.  Location: Patient: Virtual  Visit Location Patient: Home Provider: Virtual Visit Location Provider: Home Office   I discussed the limitations of evaluation and management by telemedicine and the availability of in person appointments. The patient expressed understanding and agreed to proceed.    History of Present Illness: Pamela Kim is a 78 y.o. who identifies as a female who was assigned female at birth, and is being seen today for order for Covid vaccine.   Problems:  Patient Active Problem List   Diagnosis Date Noted   Fracture of 5th metatarsal 10/20/2011    Allergies:  Allergies  Allergen Reactions   Aspirin Hives   Gabapentin Other (See Comments)    gabapentin   Keflex [Cephalexin] Hives   Neo-Synephrine [Phenylephrine Hcl (Pressors)] Other (See Comments)    Migraine headaches    Penicillins Hives   Tape     Scotch tape   Wal-Profen D Cold & Sinus [Pseudoephedrine-Ibuprofen] Other (See Comments)   Medications:  Current Outpatient Medications:    COVID-19 mRNA vaccine, Pfizer, (COMIRNATY) syringe, Inject 0.3 mLs into the muscle once for 1 dose., Disp: 0.3 mL, Rfl: 0   ARMOUR THYROID  60 MG tablet, TAKE 1 TABLET (60 MG TOTAL) BY MOUTH DAILY BEFORE BREAKFAST., Disp: 90 tablet, Rfl: 1   b complex vitamins capsule, Take 1 capsule by mouth daily., Disp: , Rfl:    Beta Carotene (VITAMIN A) 25000 UNIT capsule, Take 25,000 Units by mouth daily., Disp: , Rfl:    Cholecalciferol (VITAMIN D3) 1.25 MG (50000 UT) CAPS, Take  by mouth., Disp: , Rfl:    co-enzyme Q-10 30 MG capsule, Take 30 mg by mouth 3 (three) times daily., Disp: , Rfl:    losartan -hydrochlorothiazide (HYZAAR) 50-12.5 MG tablet, Take 1 tablet by mouth daily., Disp: 90 tablet, Rfl: 2   Omega-3 1000 MG CAPS, Take by mouth. Advance Omega, Disp: , Rfl:    ondansetron  (ZOFRAN ) 4 MG tablet, Take 1 tablet (4 mg total) by mouth every 8 (eight) hours as needed for nausea or vomiting., Disp: 20 tablet, Rfl: 0   simvastatin  (ZOCOR ) 10 MG tablet,  Take 1 tablet (10 mg total) by mouth daily at 6 PM., Disp: 90 tablet, Rfl: 3   Vitamin C, Calcium Ascorbate, SOLR, Take by mouth., Disp: , Rfl:    VITAMIN K PO, Take 120 mcg by mouth daily., Disp: , Rfl:   Observations/Objective: Patient is well-developed, well-nourished in no acute distress.  Resting comfortably at home.  Head is normocephalic, atraumatic.  No labored breathing.  Speech is clear and coherent with logical content.  Patient is alert and oriented at baseline.    Assessment and Plan: 1. Need for COVID-19 vaccine (Primary) - COVID-19 mRNA vaccine, Pfizer, (COMIRNATY) syringe; Inject 0.3 mLs into the muscle once for 1 dose.  Dispense: 0.3 mL; Refill: 0  - Covid vaccine ordered to patient pharmacy - No contraindications, no previous adverse reaction - Not currently with fever or other URI symptoms  Follow Up Instructions: I discussed the assessment and treatment plan with the patient. The patient was provided an opportunity to ask questions and all were answered. The patient agreed with the plan and demonstrated an understanding of the instructions.  A copy of instructions were sent to the patient via MyChart unless otherwise noted below.    The patient was advised to call back or seek an in-person evaluation if the symptoms worsen or if the condition fails to improve as anticipated.    Delon CHRISTELLA Dickinson, PA-C

## 2023-10-14 NOTE — Addendum Note (Signed)
 Addended by: VIVIENNE DELON HERO on: 10/14/2023 04:36 PM   Modules accepted: Level of Service

## 2023-10-14 NOTE — Telephone Encounter (Signed)
 Copied from CRM [id number]. Topic: Clinical - Request for Lab/Test Order >> Oct 07, 2023 10:52 AM Delon DASEN wrote: Reason for CRM: requesting bone density order to be sent to [facility]- fax [phone]- [facility] does not do bone scans

## 2023-10-14 NOTE — Patient Instructions (Signed)
  Pamela Kim, thank you for joining Pamela CHRISTELLA Dickinson, Pamela Kim for today's virtual visit.  While this provider is not your primary care provider (PCP), if your PCP is located in our provider database this encounter information will be shared with them immediately following your visit.   A [facility] account gives you access to today's visit and all your visits, tests, and labs performed at [facility]  click here if you don't have a [facility] account or go to [email or web address]  Consent: (Patient) Pamela Kim provided verbal consent for this virtual visit at the beginning of the encounter.  Current Medications:  Current Outpatient Medications:    COVID-19 mRNA vaccine, Pfizer, (COMIRNATY) syringe, Inject 0.3 mLs into the muscle once for 1 dose., Disp: 0.3 mL, Rfl: 0   ARMOUR THYROID  60 MG tablet, TAKE 1 TABLET (60 MG TOTAL) BY MOUTH DAILY BEFORE BREAKFAST., Disp: 90 tablet, Rfl: 1   b complex vitamins capsule, Take 1 capsule by mouth daily., Disp: , Rfl:    Beta Carotene (VITAMIN A) 25000 UNIT capsule, Take 25,000 Units by mouth daily., Disp: , Rfl:    Cholecalciferol (VITAMIN D3) 1.25 MG (50000 UT) CAPS, Take by mouth., Disp: , Rfl:    co-enzyme Q-10 30 MG capsule, Take 30 mg by mouth 3 (three) times daily., Disp: , Rfl:    losartan -hydrochlorothiazide (HYZAAR) 50-12.5 MG tablet, Take 1 tablet by mouth daily., Disp: 90 tablet, Rfl: 2   Omega-3 1000 MG CAPS, Take by mouth. Advance Omega, Disp: , Rfl:    ondansetron  (ZOFRAN ) 4 MG tablet, Take 1 tablet (4 mg total) by mouth every 8 (eight) hours as needed for nausea or vomiting., Disp: 20 tablet, Rfl: 0   simvastatin  (ZOCOR ) 10 MG tablet, Take 1 tablet (10 mg total) by mouth daily at 6 PM., Disp: 90 tablet, Rfl: 3   Vitamin C, Calcium Ascorbate, SOLR, Take by mouth., Disp: , Rfl:    VITAMIN K PO, Take 120 mcg by mouth daily., Disp: , Rfl:    Medications ordered in this encounter:  Meds  ordered this encounter  Medications   COVID-19 mRNA vaccine, Pfizer, (COMIRNATY) syringe    Sig: Inject 0.3 mLs into the muscle once for 1 dose.    Dispense:  0.3 mL    Refill:  0    Approved at provider discretion. Product selection permitted.    Supervising Provider:   BLAISE ALEENE KIDD [[id number]]     *If you need refills on other medications prior to your next appointment, please contact your pharmacy*  Follow-Up: Call back or seek an in-person evaluation if the symptoms worsen or if the condition fails to improve as anticipated.  [facility] [phone]   If you have been instructed to have an in-person evaluation today at a local Urgent Care facility, please use the link below. It will take you to a list of all of our available [facility], including address, phone number and hours of operation. Please do not delay care.  [facility]  If you or a family member do not have a primary care provider, use the link below to schedule a visit and establish care. When you choose a [facility] primary care physician or advanced practice provider, you gain a long-term partner in health. Find a Primary Care Provider  Learn more about [facility]'s in-office and virtual care options:  - Get Care Now

## 2023-10-17 DIAGNOSIS — M79641 Pain in right hand: Secondary | ICD-10-CM | POA: Diagnosis not present

## 2023-10-17 DIAGNOSIS — M79642 Pain in left hand: Secondary | ICD-10-CM | POA: Diagnosis not present

## 2023-10-17 DIAGNOSIS — M180 Bilateral primary osteoarthritis of first carpometacarpal joints: Secondary | ICD-10-CM | POA: Diagnosis not present

## 2023-10-26 ENCOUNTER — Encounter: Payer: Self-pay | Admitting: Adult Health

## 2023-10-26 NOTE — Telephone Encounter (Signed)
**Note De-identified  [facility] Obfuscation** Please advise 

## 2023-10-27 ENCOUNTER — Other Ambulatory Visit: Payer: Self-pay | Admitting: Adult Health

## 2023-10-27 MED ORDER — THYROID 60 MG PO TABS: 60.0000 mg | ORAL_TABLET | Freq: Every day | ORAL | 1 refills | Status: DC

## 2023-11-02 DIAGNOSIS — M25511 Pain in right shoulder: Secondary | ICD-10-CM | POA: Diagnosis not present

## 2023-11-02 DIAGNOSIS — M25512 Pain in left shoulder: Secondary | ICD-10-CM | POA: Diagnosis not present

## 2023-11-29 DIAGNOSIS — M19012 Primary osteoarthritis, left shoulder: Secondary | ICD-10-CM | POA: Diagnosis not present

## 2023-11-29 DIAGNOSIS — M75112 Incomplete rotator cuff tear or rupture of left shoulder, not specified as traumatic: Secondary | ICD-10-CM | POA: Diagnosis not present

## 2023-11-30 ENCOUNTER — Encounter: Payer: Self-pay | Admitting: Adult Health

## 2023-12-05 ENCOUNTER — Encounter: Payer: Self-pay | Admitting: Radiology

## 2023-12-16 ENCOUNTER — Encounter: Payer: Self-pay | Admitting: Adult Health

## 2023-12-16 DIAGNOSIS — I10 Essential (primary) hypertension: Secondary | ICD-10-CM

## 2023-12-16 DIAGNOSIS — E782 Mixed hyperlipidemia: Secondary | ICD-10-CM

## 2023-12-16 NOTE — Telephone Encounter (Signed)
**Note De-identified  [facility] Obfuscation** Please advise 

## 2023-12-20 MED ORDER — THYROID 60 MG PO TABS: 60.0000 mg | ORAL_TABLET | Freq: Every day | ORAL | 1 refills | Status: AC

## 2023-12-20 MED ORDER — SIMVASTATIN 10 MG PO TABS: 10.0000 mg | ORAL_TABLET | Freq: Every day | ORAL | 1 refills | Status: AC

## 2023-12-20 MED ORDER — LOSARTAN POTASSIUM-HCTZ 50-12.5 MG PO TABS: 1.0000 | ORAL_TABLET | Freq: Every day | ORAL | 1 refills | Status: AC

## 2024-01-03 ENCOUNTER — Ambulatory Visit: Admitting: Adult Health

## 2024-01-06 ENCOUNTER — Ambulatory Visit: Admitting: Adult Health

## 2024-01-10 ENCOUNTER — Ambulatory Visit: Admitting: Adult Health

## 2024-01-17 ENCOUNTER — Ambulatory Visit: Admitting: Adult Health

## 2024-01-18 ENCOUNTER — Ambulatory Visit: Admitting: Adult Health

## 2024-01-18 ENCOUNTER — Encounter: Payer: Self-pay | Admitting: Adult Health

## 2024-01-18 VITALS — BP 130/82 | HR 76 | Temp 97.9°F | Wt 125.2 lb

## 2024-01-18 DIAGNOSIS — G8929 Other chronic pain: Secondary | ICD-10-CM | POA: Diagnosis not present

## 2024-01-18 DIAGNOSIS — H6121 Impacted cerumen, right ear: Secondary | ICD-10-CM | POA: Diagnosis not present

## 2024-01-18 DIAGNOSIS — M25512 Pain in left shoulder: Secondary | ICD-10-CM

## 2024-01-18 NOTE — Progress Notes (Signed)
 Subjective:    Patient ID: Pamela Kim, female    DOB: October 05, 1945, 78 y.o.   MRN: [id number]  HPI  78 year old female who  has a past medical history of Hypertension, Hypothyroidism, and Osteoporosis.  She presents to the office today to discuss her upcoming left arthroscopy shoulder surgery with rotator cuff repair. She has some questions  She also believes her right ear is full of wax. She has decreased hearing and a feeling of fullness.   Review of Systems See HPI  Past Medical History:  Diagnosis Date   Hypertension    Hypothyroidism    Osteoporosis     Social History   Socioeconomic History   Marital status: Married    Spouse name: Not on file   Number of children: Not on file   Years of education: Not on file   Highest education level: Master's degree (e.g., MA, MS, MEng, MEd, MSW, MBA)  Occupational History   Not on file  Tobacco Use   Smoking status: Never   Smokeless tobacco: Never  Vaping Use   Vaping status: Never Used  Substance and Sexual Activity   Alcohol use: Yes    Comment: Twice yearly   Drug use: Not on file   Sexual activity: Not on file  Other Topics Concern   Not on file  Social History Narrative   Not on file   Social Drivers of Health   Tobacco Use: Low Risk (01/18/2024)   Patient History    Smoking Tobacco Use: Never    Smokeless Tobacco Use: Never    Passive Exposure: Not on file  Financial Resource Strain: Low Risk (01/06/2024)   Overall Financial Resource Strain (CARDIA)    Difficulty of Paying Living Expenses: Not hard at all  Food Insecurity: No Food Insecurity (01/06/2024)   Epic    Worried About Radiation Protection Practitioner of Food in the Last Year: Never true    Ran Out of Food in the Last Year: Never true  Transportation Needs: No Transportation Needs (01/06/2024)   Epic    Lack of Transportation (Medical): No    Lack of Transportation (Non-Medical): No  Physical Activity: Sufficiently Active (01/06/2024)   Exercise Vital Sign     Days of Exercise per Week: 4 days    Minutes of Exercise per Session: 60 min  Stress: No Stress Concern Present (01/06/2024)   [employer] of Occupational Health - Occupational Stress Questionnaire    Feeling of Stress: Not at all  Social Connections: Moderately Integrated (01/06/2024)   Social Connection and Isolation Panel    Frequency of Communication with Friends and Family: More than three times a week    Frequency of Social Gatherings with Friends and Family: More than three times a week    Attends Religious Services: Patient declined    Active Member of Clubs or Organizations: Yes    Attends Banker Meetings: More than 4 times per year    Marital Status: Married  Catering Manager Violence: Not on file  Depression (PHQ2-9): Low Risk (08/09/2023)   Depression (PHQ2-9)    PHQ-2 Score: 0  Alcohol Screen: Low Risk (01/06/2024)   Alcohol Screen    Last Alcohol Screening Score (AUDIT): 1  Housing: Low Risk (01/06/2024)   Epic    Unable to Pay for Housing in the Last Year: No    Number of Times Moved in the Last Year: 1    Homeless in the Last Year: No  Utilities: Not At  Risk (11/28/2023)   Received from [facility]   Epic    In the past 12 months has the electric, gas, oil, or water company threatened to shut off services in your home?: No  Health Literacy: Not on file    Past Surgical History:  Procedure Laterality Date   CATARACT EXTRACTION, BILATERAL  2016   HAND SURGERY      Family History  Problem Relation Age of Onset   Asthma Mother    Mental illness Mother    Yvone' disease Mother    COPD Mother    Heart murmur Father    Learning disabilities Sister    Hypothyroidism Sister    Hypothyroidism Sister     Allergies[1]  Medications Ordered Prior to Encounter[2]  BP 130/82   Pulse 76   Temp 97.9 F (36.6 C) (Oral)   Wt 125 lb 3.2 oz (56.8 kg)   SpO2 95%   BMI 22.90 kg/m       Objective:   Physical Exam Vitals and  nursing note reviewed.  Constitutional:      Appearance: Normal appearance.  HENT:     Right Ear: There is impacted cerumen.     Left Ear: There is no impacted cerumen.  Musculoskeletal:        General: Tenderness present. Normal range of motion.  Skin:    General: Skin is warm and dry.  Neurological:     General: No focal deficit present.     Mental Status: She is alert and oriented to person, place, and time.  Psychiatric:        Mood and Affect: Mood normal.        Behavior: Behavior normal.        Thought Content: Thought content normal.        Judgment: Judgment normal.        Assessment & Plan:  1. Chronic left shoulder pain (Primary) - I discussed her upcoming surgery and answered all questions to the best of my ability.  2. Impacted cerumen of right ear Ear Cerumen Removal  Date/Time: 01/18/2024 2:37 PM  Performed by: Merna Huxley, NP Authorized by: Merna Huxley, NP   Anesthesia: Local Anesthetic: none Ceruminolytics applied: Ceruminolytics applied prior to the procedure. Location details: right ear Patient tolerance: patient tolerated the procedure well with no immediate complications Procedure type: curette  Sedation: Patient sedated: no     Huxley Merna, NP       [1]  Allergies Allergen Reactions   Aspirin Hives   Gabapentin Other (See Comments)    gabapentin   Keflex [Cephalexin] Hives   Neo-Synephrine [Phenylephrine Hcl (Pressors)] Other (See Comments)    Migraine headaches    Penicillins Hives   Tape     Scotch tape   Wal-Profen D Cold & Sinus [Pseudoephedrine-Ibuprofen] Other (See Comments)  [2]  Current Outpatient Medications on File Prior to Visit  Medication Sig Dispense Refill   b complex vitamins capsule Take 1 capsule by mouth daily.     Beta Carotene (VITAMIN A) 25000 UNIT capsule Take 25,000 Units by mouth daily.     Cholecalciferol (VITAMIN D3) 1.25 MG (50000 UT) CAPS Take by mouth.     co-enzyme Q-10 30 MG capsule  Take 30 mg by mouth 3 (three) times daily.     losartan -hydrochlorothiazide (HYZAAR) 50-12.5 MG tablet Take 1 tablet by mouth daily. 90 tablet 1   Omega-3 1000 MG CAPS Take by mouth. Advance Omega     ondansetron  (  ZOFRAN ) 4 MG tablet Take 1 tablet (4 mg total) by mouth every 8 (eight) hours as needed for nausea or vomiting. 20 tablet 0   simvastatin  (ZOCOR ) 10 MG tablet Take 1 tablet (10 mg total) by mouth daily at 6 PM. 90 tablet 1   thyroid  (ARMOUR THYROID ) 60 MG tablet Take 1 tablet (60 mg total) by mouth daily before breakfast. 90 tablet 1   Vitamin C, Calcium Ascorbate, SOLR Take by mouth.     VITAMIN K PO Take 120 mcg by mouth daily.     No current facility-administered medications on file prior to visit.

## 2024-02-27 ENCOUNTER — Encounter: Payer: Self-pay | Admitting: Adult Health
